# Patient Record
Sex: Female | Born: 1935 | Race: White | Hispanic: No | Marital: Married | Smoking: Never smoker
Health system: Southern US, Community
[De-identification: ages and names within clinical notes are randomized; demographics above are authoritative.]

## PROBLEM LIST (undated history)

## (undated) DIAGNOSIS — K648 Other hemorrhoids: Secondary | ICD-10-CM

## (undated) DIAGNOSIS — K644 Residual hemorrhoidal skin tags: Secondary | ICD-10-CM

## (undated) DIAGNOSIS — E039 Hypothyroidism, unspecified: Secondary | ICD-10-CM

## (undated) DIAGNOSIS — I10 Essential (primary) hypertension: Secondary | ICD-10-CM

## (undated) HISTORY — DX: Hypothyroidism, unspecified: E03.9

## (undated) HISTORY — PX: COLONOSCOPY: SHX174

## (undated) HISTORY — DX: Other hemorrhoids: K64.8

## (undated) HISTORY — DX: Essential (primary) hypertension: I10

## (undated) HISTORY — DX: Residual hemorrhoidal skin tags: K64.4

---

## 1999-09-11 ENCOUNTER — Other Ambulatory Visit: Admission: RE | Admit: 1999-09-11 | Discharge: 1999-09-11 | Payer: Self-pay | Admitting: Internal Medicine

## 2002-10-07 ENCOUNTER — Other Ambulatory Visit: Admission: RE | Admit: 2002-10-07 | Discharge: 2002-10-07 | Payer: Self-pay | Admitting: Internal Medicine

## 2005-09-19 ENCOUNTER — Other Ambulatory Visit: Admission: RE | Admit: 2005-09-19 | Discharge: 2005-09-19 | Payer: Self-pay | Admitting: Internal Medicine

## 2008-10-06 ENCOUNTER — Other Ambulatory Visit: Admission: RE | Admit: 2008-10-06 | Discharge: 2008-10-06 | Payer: Self-pay | Admitting: Internal Medicine

## 2010-11-09 ENCOUNTER — Ambulatory Visit
Admission: RE | Admit: 2010-11-09 | Discharge: 2010-11-09 | Disposition: A | Discharge status: Home / Self Care | Payer: Medicare Other | Source: Ambulatory Visit | Attending: Internal Medicine | Admitting: Internal Medicine

## 2010-11-09 ENCOUNTER — Other Ambulatory Visit: Payer: Self-pay | Admitting: Internal Medicine

## 2010-11-09 DIAGNOSIS — R062 Wheezing: Secondary | ICD-10-CM

## 2010-11-09 DIAGNOSIS — R05 Cough: Secondary | ICD-10-CM

## 2013-09-30 ENCOUNTER — Ambulatory Visit (INDEPENDENT_AMBULATORY_CARE_PROVIDER_SITE_OTHER): Payer: Medicare Other | Admitting: Podiatry

## 2013-09-30 ENCOUNTER — Ambulatory Visit (INDEPENDENT_AMBULATORY_CARE_PROVIDER_SITE_OTHER): Payer: Medicare Other

## 2013-09-30 ENCOUNTER — Encounter: Payer: Self-pay | Admitting: Podiatry

## 2013-09-30 VITALS — BP 147/69 | HR 65 | Resp 16 | Ht 64.0 in | Wt 162.0 lb

## 2013-09-30 DIAGNOSIS — M779 Enthesopathy, unspecified: Secondary | ICD-10-CM

## 2013-09-30 DIAGNOSIS — L600 Ingrowing nail: Secondary | ICD-10-CM

## 2013-09-30 NOTE — Progress Notes (Signed)
   Subjective:    Patient ID: Stacy Glenn, female    DOB: 1933/11/07, 80 y.o.   MRN: [id number]  HPI Comments: i have pain when i walk in both feet. This has been going on for a while. The pain is on the bottom of both feet. Its gotten worse. It hurts to walk bare foot. i have used otc inserts, soak in vinegar and hot water.  Foot Pain      Review of Systems  HENT: Positive for sinus pressure.   Gastrointestinal:       Frequency  Musculoskeletal:       Difficulty walking  All other systems reviewed and are negative.      Objective:   Physical Exam        Assessment & Plan:

## 2013-09-30 NOTE — Progress Notes (Signed)
Subjective:     Patient ID: Stacy Glenn, female   DOB: August 04, 1933, 80 y.o.   MRN: [id number]  Foot Pain   patient presents stating I have a lot of pain on the bottom of both my feet and also I have awful big toenails which are so sore can't wear closed in shoes. Patient states she had removed a number of years ago but not permanently which she should have had done   Review of Systems  All other systems reviewed and are negative.      Objective:   Physical Exam  Nursing note and vitals reviewed. Constitutional: She is oriented to person, place, and time.  Cardiovascular: Intact distal pulses.   Musculoskeletal: Normal range of motion.  Neurological: She is oriented to person, place, and time.  Skin: Skin is warm.   neurovascular status found to be intact with muscle strength adequate and range of motion subtalar and midtarsal joint within normal limits. Patient has quite a bit of discomfort in the lesser metatarsophalangeal joints of both feet with a diminished fat pad and exposure of the bone surfaces noted. Patient is found to have severely thickened damaged nails hallux bilateral left over right    Assessment:     Metatarsalgia with chronic capsulitis and atrophied fat pad bilateral metatarsal bones and severe nail disease with pain hallux both feet    Plan:     Reviewed both conditions and today scanned for custom orthotics to reduce plantar pain. I then went ahead and discussed removal of nails which she wants done and will have done next week and we will get the orthotics as soon as they are returned. Scheduled for AP x2

## 2013-10-07 ENCOUNTER — Ambulatory Visit: Payer: Medicare Other | Admitting: Podiatry

## 2013-12-03 ENCOUNTER — Ambulatory Visit: Payer: Medicare Other

## 2013-12-03 DIAGNOSIS — M779 Enthesopathy, unspecified: Secondary | ICD-10-CM

## 2013-12-03 NOTE — Patient Instructions (Signed)

## 2013-12-03 NOTE — Progress Notes (Signed)
Pt is here to PUO 

## 2015-11-29 ENCOUNTER — Telehealth: Payer: Self-pay | Admitting: Internal Medicine

## 2015-11-29 NOTE — Telephone Encounter (Signed)
I spoke with Dr. Chilton SiGreen.  He is going to call me tomorrow when he gets back to office with his records

## 2015-11-29 NOTE — Telephone Encounter (Signed)
I left a message for Dr. Chilton SiGreen

## 2015-11-30 ENCOUNTER — Encounter: Payer: Self-pay | Admitting: Internal Medicine

## 2015-11-30 NOTE — Telephone Encounter (Signed)
I spoke with Dr. Chilton Si patient did have a colonoscopy with Dr. Leone Payor in 2015.  Dr. Leone Payor also reviewed.  Patient needs an office visit to discuss colonoscopy.   Left message for patient to call back

## 2015-12-05 NOTE — Telephone Encounter (Signed)
Patient is scheduled for january

## 2016-02-13 ENCOUNTER — Ambulatory Visit (INDEPENDENT_AMBULATORY_CARE_PROVIDER_SITE_OTHER): Payer: Medicare Other | Admitting: Internal Medicine

## 2016-02-13 ENCOUNTER — Encounter: Payer: Self-pay | Admitting: Internal Medicine

## 2016-02-13 VITALS — BP 124/66 | HR 80 | Ht 64.0 in | Wt 158.0 lb

## 2016-02-13 DIAGNOSIS — Z1211 Encounter for screening for malignant neoplasm of colon: Secondary | ICD-10-CM | POA: Diagnosis not present

## 2016-02-13 NOTE — Patient Instructions (Signed)
   Call us back with any signs or symptoms that we need to address.     I appreciate the opportunity to care for you. Stan Headarl Gessner, MD, [facility]

## 2016-02-13 NOTE — Progress Notes (Signed)
   Stacie GlazeModena L Renovato 83 y.o. 05-28-1933 [id number]  Assessment & Plan:   Encounter Diagnosis  Name Primary?  . Colon cancer screening Yes   We reviewed things and she has decided not to pursue further colon cancer screening at her age which is reasonable. We did discuss options of hemocclts, Cologuard and colonoscopy. She will see me prn and we can investigate signs and sxs as needed  I appreciate the opportunity to care for this patient. Cc: Dr. Elmore GuiseEd Green  Subjective:   Chief Complaint: colon cancer screening  HPI  83 yo ww - no GI sxs here re: "should I have another colonoscopy?" 2005 no polyps on colonoscopy    Medications, allergies, past medical history, past surgical history, family history and social history are reviewed and updated in the EMR.  Review of Systems Some urinary leakage/incontinence - all others negative  Objective:   Physical Exam BP 124/66   Pulse 80   Ht 5\' 4"  (1.626 m)   Wt 158 lb (71.7 kg)   BMI 27.12 kg/m  NAD Eyes anicteric Appropriate mood/affect Alert and oriented x 3

## 2016-06-24 ENCOUNTER — Other Ambulatory Visit: Payer: Self-pay | Admitting: Internal Medicine

## 2016-06-24 DIAGNOSIS — R310 Gross hematuria: Secondary | ICD-10-CM

## 2016-06-25 ENCOUNTER — Ambulatory Visit
Admission: RE | Admit: 2016-06-25 | Discharge: 2016-06-25 | Disposition: A | Discharge status: Home / Self Care | Payer: Medicare Other | Source: Ambulatory Visit | Attending: Internal Medicine | Admitting: Internal Medicine

## 2016-06-25 DIAGNOSIS — R310 Gross hematuria: Secondary | ICD-10-CM

## 2019-11-10 ENCOUNTER — Encounter: Payer: Self-pay | Admitting: Podiatry

## 2019-11-10 ENCOUNTER — Ambulatory Visit: Payer: Medicare Other | Admitting: Podiatry

## 2019-11-10 ENCOUNTER — Other Ambulatory Visit: Payer: Self-pay

## 2019-11-10 DIAGNOSIS — M79674 Pain in right toe(s): Secondary | ICD-10-CM

## 2019-11-10 DIAGNOSIS — M79675 Pain in left toe(s): Secondary | ICD-10-CM | POA: Diagnosis not present

## 2019-11-10 DIAGNOSIS — B351 Tinea unguium: Secondary | ICD-10-CM

## 2019-11-10 DIAGNOSIS — Q828 Other specified congenital malformations of skin: Secondary | ICD-10-CM | POA: Diagnosis not present

## 2019-11-10 NOTE — Progress Notes (Signed)
This patient presents to the office for evaluation and treatment of her feet.  She says her big toenails have grown thick and painful.  These nails are painful wearing her shoes.  She also has a painful callus under the ball of her right foot.  This has become painful walking and wearing her shoes.  She presents to the office for preventative foot care services.  General Appearance  Alert, conversant and in no acute stress.  Vascular  Dorsalis pedis  are palpable  Bilaterally.  Posterior tibial pulses are weakly palpable.    Capillary return is within normal limits  bilaterally. Temperature is within normal limits  bilaterally.  Neurologic  Senn-Weinstein monofilament wire test within normal limits  bilaterally. Muscle power within normal limits bilaterally.  Nails Thick disfigured discolored nails with subungual debris  from hallux to fifth toes bilaterally. No evidence of bacterial infection or drainage bilaterally.  Orthopedic  No limitations of motion  feet .  No crepitus or effusions noted.  No bony pathology or digital deformities noted.  Plantarflexed second metatarsal head right foot.  Contracted digits  B/L.  Skin  normotropic skin with no porokeratosis noted bilaterally.  No signs of infections or ulcers noted.    Onychomycosis  Porokeratosis  Right forefoot.  Debride nails with nail nipper and dremel tool.  Debride porokeratosis with # 15 blade.  RTC 3 months.  Helane Gunther DPM

## 2020-01-10 ENCOUNTER — Other Ambulatory Visit: Payer: Self-pay | Admitting: Internal Medicine

## 2020-01-10 DIAGNOSIS — Z1231 Encounter for screening mammogram for malignant neoplasm of breast: Secondary | ICD-10-CM

## 2020-01-10 DIAGNOSIS — Z0000 Encounter for general adult medical examination without abnormal findings: Secondary | ICD-10-CM

## 2020-01-28 ENCOUNTER — Other Ambulatory Visit: Payer: Self-pay

## 2020-01-28 ENCOUNTER — Ambulatory Visit (HOSPITAL_BASED_OUTPATIENT_CLINIC_OR_DEPARTMENT_OTHER)
Admission: RE | Admit: 2020-01-28 | Discharge: 2020-01-28 | Disposition: A | Discharge status: Home / Self Care | Payer: Medicare Other | Source: Ambulatory Visit | Attending: Internal Medicine | Admitting: Internal Medicine

## 2020-01-28 ENCOUNTER — Other Ambulatory Visit (HOSPITAL_BASED_OUTPATIENT_CLINIC_OR_DEPARTMENT_OTHER): Payer: Self-pay | Admitting: Internal Medicine

## 2020-01-28 DIAGNOSIS — R410 Disorientation, unspecified: Secondary | ICD-10-CM

## 2020-01-28 DIAGNOSIS — W19XXXD Unspecified fall, subsequent encounter: Secondary | ICD-10-CM

## 2020-01-30 ENCOUNTER — Other Ambulatory Visit: Payer: Self-pay

## 2020-01-30 ENCOUNTER — Inpatient Hospital Stay (HOSPITAL_BASED_OUTPATIENT_CLINIC_OR_DEPARTMENT_OTHER)
Admission: EM | Admit: 2020-01-30 | Discharge: 2020-02-02 | DRG: 690 | Disposition: A | Discharge status: Home Health | Payer: Medicare Other | Attending: Internal Medicine | Admitting: Internal Medicine

## 2020-01-30 ENCOUNTER — Encounter (HOSPITAL_BASED_OUTPATIENT_CLINIC_OR_DEPARTMENT_OTHER): Payer: Self-pay | Admitting: Emergency Medicine

## 2020-01-30 ENCOUNTER — Emergency Department (HOSPITAL_BASED_OUTPATIENT_CLINIC_OR_DEPARTMENT_OTHER): Payer: Medicare Other

## 2020-01-30 DIAGNOSIS — Z79890 Hormone replacement therapy (postmenopausal): Secondary | ICD-10-CM | POA: Diagnosis not present

## 2020-01-30 DIAGNOSIS — N390 Urinary tract infection, site not specified: Secondary | ICD-10-CM | POA: Diagnosis present

## 2020-01-30 DIAGNOSIS — R627 Adult failure to thrive: Secondary | ICD-10-CM | POA: Diagnosis present

## 2020-01-30 DIAGNOSIS — E039 Hypothyroidism, unspecified: Secondary | ICD-10-CM | POA: Diagnosis not present

## 2020-01-30 DIAGNOSIS — Z79899 Other long term (current) drug therapy: Secondary | ICD-10-CM | POA: Diagnosis not present

## 2020-01-30 DIAGNOSIS — I10 Essential (primary) hypertension: Secondary | ICD-10-CM | POA: Diagnosis not present

## 2020-01-30 DIAGNOSIS — B952 Enterococcus as the cause of diseases classified elsewhere: Secondary | ICD-10-CM | POA: Diagnosis not present

## 2020-01-30 DIAGNOSIS — R531 Weakness: Secondary | ICD-10-CM | POA: Diagnosis present

## 2020-01-30 DIAGNOSIS — Z20822 Contact with and (suspected) exposure to covid-19: Secondary | ICD-10-CM | POA: Diagnosis not present

## 2020-01-30 DIAGNOSIS — R509 Fever, unspecified: Secondary | ICD-10-CM

## 2020-01-30 DIAGNOSIS — Z8249 Family history of ischemic heart disease and other diseases of the circulatory system: Secondary | ICD-10-CM | POA: Diagnosis not present

## 2020-01-30 DIAGNOSIS — D649 Anemia, unspecified: Secondary | ICD-10-CM | POA: Diagnosis not present

## 2020-01-30 DIAGNOSIS — N12 Tubulo-interstitial nephritis, not specified as acute or chronic: Principal | ICD-10-CM | POA: Diagnosis present

## 2020-01-30 LAB — CBC WITH DIFFERENTIAL/PLATELET
Abs Immature Granulocytes: 0.02 10*3/uL (ref 0.00–0.07)
Basophils Absolute: 0 10*3/uL (ref 0.0–0.1)
Basophils Relative: 0 %
Eosinophils Absolute: 0 10*3/uL (ref 0.0–0.5)
Eosinophils Relative: 0 %
HCT: 31.8 % — ABNORMAL LOW (ref 36.0–46.0)
Hemoglobin: 10.8 g/dL — ABNORMAL LOW (ref 12.0–15.0)
Immature Granulocytes: 0 %
Lymphocytes Relative: 18 %
Lymphs Abs: 1.4 10*3/uL (ref 0.7–4.0)
MCH: 31 pg (ref 26.0–34.0)
MCHC: 34 g/dL (ref 30.0–36.0)
MCV: 91.4 fL (ref 80.0–100.0)
Monocytes Absolute: 0.4 10*3/uL (ref 0.1–1.0)
Monocytes Relative: 6 %
Neutro Abs: 5.8 10*3/uL (ref 1.7–7.7)
Neutrophils Relative %: 76 %
Platelets: 227 10*3/uL (ref 150–400)
RBC: 3.48 MIL/uL — ABNORMAL LOW (ref 3.87–5.11)
RDW: 13.5 % (ref 11.5–15.5)
WBC: 7.7 10*3/uL (ref 4.0–10.5)
nRBC: 0 % (ref 0.0–0.2)

## 2020-01-30 LAB — URINALYSIS, ROUTINE W REFLEX MICROSCOPIC
Bilirubin Urine: NEGATIVE
Glucose, UA: NEGATIVE mg/dL
Hgb urine dipstick: NEGATIVE
Ketones, ur: 80 mg/dL — AB
Leukocytes,Ua: NEGATIVE
Nitrite: NEGATIVE
Protein, ur: 30 mg/dL — AB
Specific Gravity, Urine: 1.015 (ref 1.005–1.030)
pH: 7 (ref 5.0–8.0)

## 2020-01-30 LAB — URINALYSIS, MICROSCOPIC (REFLEX)

## 2020-01-30 LAB — COMPREHENSIVE METABOLIC PANEL
ALT: 23 U/L (ref 0–44)
AST: 26 U/L (ref 15–41)
Albumin: 3.9 g/dL (ref 3.5–5.0)
Alkaline Phosphatase: 37 U/L — ABNORMAL LOW (ref 38–126)
Anion gap: 11 (ref 5–15)
BUN: 17 mg/dL (ref 8–23)
CO2: 26 mmol/L (ref 22–32)
Calcium: 8.8 mg/dL — ABNORMAL LOW (ref 8.9–10.3)
Chloride: 96 mmol/L — ABNORMAL LOW (ref 98–111)
Creatinine, Ser: 0.97 mg/dL (ref 0.44–1.00)
GFR, Estimated: 58 mL/min — ABNORMAL LOW (ref >60)
Glucose, Bld: 100 mg/dL — ABNORMAL HIGH (ref 70–99)
Potassium: 3.7 mmol/L (ref 3.5–5.1)
Sodium: 133 mmol/L — ABNORMAL LOW (ref 135–145)
Total Bilirubin: 0.7 mg/dL (ref 0.3–1.2)
Total Protein: 6.8 g/dL (ref 6.5–8.1)

## 2020-01-30 LAB — RESP PANEL BY RT-PCR (FLU A&B, COVID) ARPGX2
Influenza A by PCR: NEGATIVE
Influenza B by PCR: NEGATIVE
SARS Coronavirus 2 by RT PCR: NEGATIVE

## 2020-01-30 LAB — AMMONIA: Ammonia: <9 umol/L — ABNORMAL LOW (ref 9–35)

## 2020-01-30 MED ORDER — GADOBUTROL 1 MMOL/ML IV SOLN
7.0000 mL | Freq: Once | INTRAVENOUS | Status: AC | PRN
  Administered 2020-01-30: 7 mL via INTRAVENOUS

## 2020-01-30 MED ORDER — SODIUM CHLORIDE 0.9 % IV SOLN: 1.0000 g | Freq: Once | INTRAVENOUS | Status: DC

## 2020-01-30 MED ORDER — LORAZEPAM 2 MG/ML IJ SOLN
0.5000 mg | Freq: Once | INTRAMUSCULAR | Status: AC
  Administered 2020-01-30: 16:00:00 0.5 mg via INTRAVENOUS
  Filled 2020-01-30: qty 1

## 2020-01-30 MED ORDER — CIPROFLOXACIN IN D5W 400 MG/200ML IV SOLN
400.0000 mg | Freq: Once | INTRAVENOUS | Status: AC
  Administered 2020-01-30: 19:00:00 400 mg via INTRAVENOUS
  Filled 2020-01-30: qty 200

## 2020-01-30 NOTE — ED Provider Notes (Signed)
[facility] EMERGENCY DEPARTMENT Provider Note   CSN: [id number] Arrival date & time: 01/30/20  1225     History Chief Complaint  Patient presents with  . Fever    Stacy Glenn is a 86 y.o. female.  HPI   Patient presents to the ED for evaluation of trouble with fever headache body aches.  Patient has been having issues ongoing for at least the past week.  Patient according to the medical records was first seen for myalgias on December 19.  Patient had laboratory tests urinalysis and covid flu test.  All were negative.  Patient ended up going to another emergency room on the 20th.  There was question of possible bowel obstruction.  Plan was for admission but they did not have any bed availability and so the plan was to have her transferred to [facility].  Has beds were not available patient had improved she ended up getting discharged with plans to go to [facility] emergency room for reevaluation on the 21st.  Patient was seen then.  Patient in getting evaluated and released.  Patient then saw her primary care doctor on Friday.  Etiology of her symptoms still remains unclear.  Patient states she had previously been dying with urinary tract infection had been treated with antibiotics.  Over the weekend she started having increasing trouble with headache as well as pain in her neck and diffuse body aches.  She has felt febrile but has not had a fever in a few days.  Discussed with Dr Thornell Mule.  Pt does have a confirmed enterococcus uti.  Started on cipro.  Would like her to be admitted for uti, failed outpatient treatment.  Past Medical History:  Diagnosis Date  . External hemorrhoids   . Hypertension   . Hypothyroidism   . Internal hemorrhoids     Patient Active Problem List   Diagnosis Date Noted  . Pain due to onychomycosis of toenails of both feet 11/10/2019  . Porokeratosis 11/10/2019    Past Surgical History:  Procedure Laterality Date  . COLONOSCOPY        OB History   No obstetric history on file.     Family History  Problem Relation Age of Onset  . Heart disease Mother   . Heart disease Father   . Heart disease Brother   . Heart disease Brother   . Breast cancer Paternal Aunt   . Colon cancer Neg Hx   . Stomach cancer Neg Hx   . Rectal cancer Neg Hx   . Esophageal cancer Neg Hx   . Liver cancer Neg Hx     Social History   Tobacco Use  . Smoking status: Never Smoker  . Smokeless tobacco: Never Used  Vaping Use  . Vaping Use: Never used  Substance Use Topics  . Alcohol use: No  . Drug use: No    Home Medications Prior to Admission medications   Medication Sig Start Date End Date Taking? Authorizing Provider  halobetasol (ULTRAVATE) 0.05 % cream halobetasol propionate 0.05 % topical cream  APPLY TO AFFECTED AREA AFTER WASHING    [provider]  levothyroxine (SYNTHROID) 88 MCG tablet Take by mouth.    [provider]  losartan-hydrochlorothiazide (HYZAAR) 100-25 MG tablet losartan 100 mg-hydrochlorothiazide 25 mg tablet    [provider]    Allergies    Patient has no known allergies.  Review of Systems   Review of Systems  All other systems reviewed and are negative.  Physical Exam Updated Vital Signs BP 137/63 (BP Location: Right Arm)   Pulse 70   Temp 98.8 F (37.1 C) (Oral)   Resp 16   Ht 1.626 m (5\' 4" )   Wt 70.3 kg   SpO2 99%   BMI 26.61 kg/m   Physical Exam Vitals and nursing note reviewed.  Constitutional:      General: She is not in acute distress.    Appearance: She is well-developed and well-nourished.  HENT:     Head: Normocephalic and atraumatic.     Right Ear: External ear normal.     Left Ear: External ear normal.  Eyes:     General: No scleral icterus.       Right eye: No discharge.        Left eye: No discharge.     Conjunctiva/sclera: Conjunctivae normal.  Neck:     Trachea: No tracheal deviation.  Cardiovascular:     Rate and Rhythm:  Normal rate and regular rhythm.     Pulses: Intact distal pulses.  Pulmonary:     Effort: Pulmonary effort is normal. No respiratory distress.     Breath sounds: Normal breath sounds. No stridor. No wheezing or rales.  Abdominal:     General: Bowel sounds are normal. There is no distension.     Palpations: Abdomen is soft.     Tenderness: There is no abdominal tenderness. There is no guarding or rebound.  Musculoskeletal:        General: No tenderness or edema.     Cervical back: Normal range of motion and neck supple. No rigidity or tenderness.  Skin:    General: Skin is warm and dry.     Findings: No rash.  Neurological:     General: No focal deficit present.     Mental Status: She is alert.     Cranial Nerves: No cranial nerve deficit (no facial droop, extraocular movements intact, no slurred speech).     Sensory: No sensory deficit.     Motor: No abnormal muscle tone or seizure activity.     Coordination: Coordination normal.     Deep Tendon Reflexes: Strength normal.  Psychiatric:        Mood and Affect: Mood and affect normal.     ED Results / Procedures / Treatments   Labs (all labs ordered are listed, but only abnormal results are displayed) Labs Reviewed  CBC WITH DIFFERENTIAL/PLATELET  URINALYSIS, ROUTINE W REFLEX MICROSCOPIC  COMPREHENSIVE METABOLIC PANEL  AMMONIA    EKG None  Radiology No results found.  Procedures Procedures (including critical care time)  Medications Ordered in ED Medications  LORazepam (ATIVAN) injection 0.5 mg (has no administration in time range)    ED Course  I have reviewed the triage vital signs and the nursing notes.  Pertinent labs & imaging results that were available during my care of the patient were reviewed by me and considered in my medical decision making (see chart for details).    MDM Rules/Calculators/A&P                          Patient has had persistent issues with cough, body aches, confusion and fever.   Patient has been seen multiple times for this condition recently.  Patient last saw her primary care doctor on Friday.  Plan is to repeat laboratory tests.  Based on my exam I have low suspicion for meningitis as she has no neck stiffness full  range of motion.  Plan is for MRI, labs.  Care turned over to Dr Adela Lank. Final Clinical Impression(s) / ED Diagnoses pending   Linwood Dibbles, MD 01/30/20 1530

## 2020-01-30 NOTE — ED Notes (Signed)
Pt states rec both Covid Vaccines and rec Booster Vaccine as well at [facility] approx 2 months ago

## 2020-01-30 NOTE — ED Notes (Addendum)
Blood cultures ordered after antibiotics; Spoke to EDP about getting blood cultures after pt has been on antibiotic, EDP said to get them anyway per hospitalist

## 2020-01-30 NOTE — ED Notes (Signed)
As I write this, she is in MRI. 

## 2020-01-30 NOTE — ED Notes (Signed)
Presents today with fever and feeling sore all over, having HA, onset approx 1 month ago. Had some vomiting today. This past Friday went to Primary Care, rec some type of abx injection, and rx for abx as well.

## 2020-01-30 NOTE — ED Triage Notes (Signed)
Pt brought in by family with c/o fever, confusion, balance issues, headache and stiff neck for more than a week. Pt seen by PCP on Friday.

## 2020-01-30 NOTE — ED Notes (Signed)
Pt states she is taking Cipro for a UTI Dx from her primary care MD

## 2020-01-30 NOTE — ED Provider Notes (Signed)
86 yo F with weakness, fevers, headache. Signed out to me by Dr. Lynelle Doctor. Please see his note for full H&P. Briefly seen now 4x in past week.  Sent by PCP for admission.  Plan for labwork, MRI.  Discussion with hospitalist.   Reevaluated the patient patient still feeling very fatigued.  States that this is been an ongoing issue for the past week and she is not been doing very well at home.  At this point I feel the patient likely has pyelonephritis secondary to her Enterococcus urinary tract infection.  I did discuss antibiotic usage for her with the pharmacist, recommended continuing IV ciprofloxacin until better cultures and sensitivities had resulted.  Will discuss with medicine for admission.       Melene Plan, DO 01/30/20 [phone]

## 2020-01-30 NOTE — ED Notes (Signed)
Pt encouraged to void; pt said she will call when she has to go

## 2020-01-31 ENCOUNTER — Observation Stay (HOSPITAL_COMMUNITY): Payer: Medicare Other

## 2020-01-31 DIAGNOSIS — B952 Enterococcus as the cause of diseases classified elsewhere: Secondary | ICD-10-CM

## 2020-01-31 DIAGNOSIS — N390 Urinary tract infection, site not specified: Secondary | ICD-10-CM | POA: Diagnosis not present

## 2020-01-31 LAB — TSH: TSH: 8.828 u[IU]/mL — ABNORMAL HIGH (ref 0.350–4.500)

## 2020-01-31 LAB — COMPREHENSIVE METABOLIC PANEL
ALT: 24 U/L (ref 0–44)
AST: 24 U/L (ref 15–41)
Albumin: 3.7 g/dL (ref 3.5–5.0)
Alkaline Phosphatase: 36 U/L — ABNORMAL LOW (ref 38–126)
Anion gap: 11 (ref 5–15)
BUN: 15 mg/dL (ref 8–23)
CO2: 25 mmol/L (ref 22–32)
Calcium: 8.7 mg/dL — ABNORMAL LOW (ref 8.9–10.3)
Chloride: 99 mmol/L (ref 98–111)
Creatinine, Ser: 0.92 mg/dL (ref 0.44–1.00)
GFR, Estimated: >60 mL/min (ref >60)
Glucose, Bld: 99 mg/dL (ref 70–99)
Potassium: 3.7 mmol/L (ref 3.5–5.1)
Sodium: 135 mmol/L (ref 135–145)
Total Bilirubin: 0.9 mg/dL (ref 0.3–1.2)
Total Protein: 6.7 g/dL (ref 6.5–8.1)

## 2020-01-31 LAB — CBC
HCT: 29.5 % — ABNORMAL LOW (ref 36.0–46.0)
Hemoglobin: 9.9 g/dL — ABNORMAL LOW (ref 12.0–15.0)
MCH: 30.9 pg (ref 26.0–34.0)
MCHC: 33.6 g/dL (ref 30.0–36.0)
MCV: 92.2 fL (ref 80.0–100.0)
Platelets: 215 10*3/uL (ref 150–400)
RBC: 3.2 MIL/uL — ABNORMAL LOW (ref 3.87–5.11)
RDW: 13.4 % (ref 11.5–15.5)
WBC: 7.6 10*3/uL (ref 4.0–10.5)
nRBC: 0 % (ref 0.0–0.2)

## 2020-01-31 LAB — CBC WITH DIFFERENTIAL/PLATELET
Abs Immature Granulocytes: 0.02 10*3/uL (ref 0.00–0.07)
Basophils Absolute: 0 10*3/uL (ref 0.0–0.1)
Basophils Relative: 0 %
Eosinophils Absolute: 0.2 10*3/uL (ref 0.0–0.5)
Eosinophils Relative: 2 %
HCT: 32.5 % — ABNORMAL LOW (ref 36.0–46.0)
Hemoglobin: 10.7 g/dL — ABNORMAL LOW (ref 12.0–15.0)
Immature Granulocytes: 0 %
Lymphocytes Relative: 22 %
Lymphs Abs: 1.8 10*3/uL (ref 0.7–4.0)
MCH: 30.5 pg (ref 26.0–34.0)
MCHC: 32.9 g/dL (ref 30.0–36.0)
MCV: 92.6 fL (ref 80.0–100.0)
Monocytes Absolute: 0.4 10*3/uL (ref 0.1–1.0)
Monocytes Relative: 5 %
Neutro Abs: 5.8 10*3/uL (ref 1.7–7.7)
Neutrophils Relative %: 71 %
Platelets: 257 10*3/uL (ref 150–400)
RBC: 3.51 MIL/uL — ABNORMAL LOW (ref 3.87–5.11)
RDW: 13.6 % (ref 11.5–15.5)
WBC: 8.2 10*3/uL (ref 4.0–10.5)
nRBC: 0 % (ref 0.0–0.2)

## 2020-01-31 LAB — T4, FREE: Free T4: 1.09 ng/dL (ref 0.61–1.12)

## 2020-01-31 LAB — MAGNESIUM: Magnesium: 2.2 mg/dL (ref 1.7–2.4)

## 2020-01-31 LAB — CREATININE, SERUM
Creatinine, Ser: 0.79 mg/dL (ref 0.44–1.00)
GFR, Estimated: >60 mL/min (ref >60)

## 2020-01-31 MED ORDER — ADULT MULTIVITAMIN W/MINERALS CH
1.0000 | ORAL_TABLET | Freq: Every day | ORAL | Status: DC
  Administered 2020-01-31 – 2020-02-02 (×3): 1 via ORAL
  Filled 2020-01-31 (×3): qty 1

## 2020-01-31 MED ORDER — VANCOMYCIN HCL IN DEXTROSE 1-5 GM/200ML-% IV SOLN
1000.0000 mg | INTRAVENOUS | Status: DC
  Administered 2020-02-01: 13:00:00 1000 mg via INTRAVENOUS
  Filled 2020-01-31 (×2): qty 200

## 2020-01-31 MED ORDER — VANCOMYCIN HCL 1250 MG/250ML IV SOLN
1250.0000 mg | Freq: Once | INTRAVENOUS | Status: AC
  Administered 2020-01-31: 12:00:00 1250 mg via INTRAVENOUS
  Filled 2020-01-31: qty 250

## 2020-01-31 MED ORDER — SODIUM CHLORIDE 0.9 % IV SOLN
2.0000 g | INTRAVENOUS | Status: DC
  Administered 2020-02-01 – 2020-02-02 (×2): 2 g via INTRAVENOUS
  Filled 2020-01-31 (×2): qty 2

## 2020-01-31 MED ORDER — ONDANSETRON HCL 4 MG/2ML IJ SOLN: 4.0000 mg | Freq: Four times a day (QID) | INTRAMUSCULAR | Status: DC | PRN

## 2020-01-31 MED ORDER — SODIUM CHLORIDE 0.9 % IV SOLN
1.0000 g | Freq: Once | INTRAVENOUS | Status: AC
  Administered 2020-01-31: 11:00:00 1 g via INTRAVENOUS
  Filled 2020-01-31: qty 10

## 2020-01-31 MED ORDER — SODIUM CHLORIDE 0.9 % IV SOLN
1.0000 g | INTRAVENOUS | Status: DC
  Administered 2020-01-31: 08:00:00 1 g via INTRAVENOUS
  Filled 2020-01-31: qty 10

## 2020-01-31 MED ORDER — LEVOTHYROXINE SODIUM 88 MCG PO TABS
88.0000 ug | ORAL_TABLET | Freq: Every day | ORAL | Status: DC
  Administered 2020-01-31 – 2020-02-01 (×2): 88 ug via ORAL
  Filled 2020-01-31 (×2): qty 1

## 2020-01-31 MED ORDER — POLYVINYL ALCOHOL 1.4 % OP SOLN
1.0000 [drp] | OPHTHALMIC | Status: DC | PRN
  Filled 2020-01-31: qty 15

## 2020-01-31 MED ORDER — ENOXAPARIN SODIUM 40 MG/0.4ML ~~LOC~~ SOLN
40.0000 mg | SUBCUTANEOUS | Status: DC
  Administered 2020-01-31 – 2020-02-02 (×3): 40 mg via SUBCUTANEOUS
  Filled 2020-01-31 (×3): qty 0.4

## 2020-01-31 MED ORDER — ACETAMINOPHEN 325 MG PO TABS
325.0000 mg | ORAL_TABLET | Freq: Four times a day (QID) | ORAL | Status: DC | PRN
  Administered 2020-02-01 – 2020-02-02 (×2): 325 mg via ORAL
  Filled 2020-01-31 (×2): qty 1

## 2020-01-31 MED ORDER — SODIUM CHLORIDE 0.9 % IV SOLN: Freq: Once | INTRAVENOUS | Status: AC

## 2020-01-31 NOTE — ED Notes (Signed)
Spoke to pt son Stacy Glenn per pt request; updated on pt transfer and given pt room information

## 2020-01-31 NOTE — H&P (Signed)
History and Physical    Stacy Glenn VOZ:[id number] DOB: Jun 22, 1933 DOA: 01/30/2020  PCP: Charlane Ferretti, DO  Patient coming from: [facility]  Chief Complaint: generalized weakness and achiness.  HPI: Stacy Glenn is a 86 y.o. female with medical history significant of HTN, hypothyroidism. Presenting with generalized weakness and achiness. She reports her symptoms began several weeks ago. She was seen by a urologist at the time and was diagnosed with an enterococcus UTI. She was placed on abx, but she didn't seem to improve. She made multiple visits to multiple EDs over the next couple of weeks, but did not seem to have an improvement in her weakness and achiness. She saw her PCP who changed up her antibiotic regimen. I spoke with her PCP, Dr. Thornell Mule. He notes that she seemed a little more confused and weak. He gave her rocephin and cipro; but she apparently did not improve over the next couple of days. She decided that she needed to come back to the ED. She denies any other aggravating or alleviating factors. She denies any other treatments.     ED Course: At [facility], MRI brain did not show any acute issue. Lab work was ok. UCx was collected and she was started on IV cipro with the presumption that she had a UTI worsening to pyelonephritis. [facility] was called for admission.   Review of Systems:  Review of systems is otherwise negative for all not mentioned in HPI.   PMHx Past Medical History:  Diagnosis Date  . External hemorrhoids   . Hypertension   . Hypothyroidism   . Internal hemorrhoids     PSHx Past Surgical History:  Procedure Laterality Date  . COLONOSCOPY      SocHx  reports that she has never smoked. She has never used smokeless tobacco. She reports that she does not drink alcohol and does not use drugs.  No Known Allergies  FamHx Family History  Problem Relation Age of Onset  . Heart disease Mother   . Heart disease Father   . Heart disease Brother   . Heart disease  Brother   . Breast cancer Paternal Aunt   . Colon cancer Neg Hx   . Stomach cancer Neg Hx   . Rectal cancer Neg Hx   . Esophageal cancer Neg Hx   . Liver cancer Neg Hx     Prior to Admission medications   Medication Sig Start Date End Date Taking? Authorizing Provider  ciprofloxacin (CIPRO) 500 MG tablet Take 1 tablet by mouth in the morning and at bedtime. 10 day supply 01/28/20  Yes [provider]  GAVILAX 17 GM/SCOOP powder Take 17 g by mouth daily as needed for constipation. 01/26/20  Yes [provider]  levothyroxine (SYNTHROID) 88 MCG tablet Take 88 mcg by mouth daily before breakfast.   Yes [provider]  losartan-hydrochlorothiazide (HYZAAR) 100-25 MG tablet Take 1 tablet by mouth daily.   Yes [provider]  Multiple Vitamin (MULTIVITAMIN WITH MINERALS) TABS tablet Take 1 tablet by mouth daily.   Yes [provider]  polyvinyl alcohol (LIQUIFILM TEARS) 1.4 % ophthalmic solution Place 1 drop into both eyes as needed for dry eyes.   Yes [provider]  amoxicillin (AMOXIL) 500 MG tablet Take 500 mg by mouth 3 (three) times daily. 01/20/20   [provider]    Physical Exam: Vitals:   01/30/20 2100 01/31/20 0030 01/31/20 0300 01/31/20 0445  BP: 99/66 (!) 126/51 (!) 144/64 (!) 127/53  Pulse: Marland Kitchen)  58 63 70 68  Resp: 14 14 14 16   Temp:   98.5 F (36.9 C) 98.2 F (36.8 C)  TempSrc:   Oral Oral  SpO2: 95% 96% 99% 99%  Weight:      Height:        General: 86 y.o. female resting in bed in NAD Eyes: PERRL, normal sclera ENMT: Nares patent w/o discharge, orophaynx clear, dentition normal, ears w/o discharge/lesions/ulcers Neck: Supple, trachea midline Cardiovascular: RRR, +S1, S2, no m/g/r, equal pulses throughout Respiratory: CTABL, no w/r/r, normal WOB GI: BS+, NDNT, no masses noted, no organomegaly noted MSK: No e/c/c Skin: No rashes, bruises, ulcerations noted Neuro: A&O x 3, no focal deficits, no nuchal  rigidity, no photosensitivity Psyc: Appropriate interaction and affect, calm/cooperative  Labs on Admission: I have personally reviewed following labs and imaging studies  CBC: Recent Labs  Lab 01/30/20 1606  WBC 7.7  NEUTROABS 5.8  HGB 10.8*  HCT 31.8*  MCV 91.4  PLT 227   Basic Metabolic Panel: Recent Labs  Lab 01/30/20 1606  NA 133*  K 3.7  CL 96*  CO2 26  GLUCOSE 100*  BUN 17  CREATININE 0.97  CALCIUM 8.8*   GFR: Estimated Creatinine Clearance: 41.5 mL/min (by C-G formula based on SCr of 0.97 mg/dL). Liver Function Tests: Recent Labs  Lab 01/30/20 1606  AST 26  ALT 23  ALKPHOS 37*  BILITOT 0.7  PROT 6.8  ALBUMIN 3.9   No results for input(s): LIPASE, AMYLASE in the last 168 hours. Recent Labs  Lab 01/30/20 1606  AMMONIA <9*   Coagulation Profile: No results for input(s): INR, PROTIME in the last 168 hours. Cardiac Enzymes: No results for input(s): CKTOTAL, CKMB, CKMBINDEX, TROPONINI in the last 168 hours. BNP (last 3 results) No results for input(s): PROBNP in the last 8760 hours. HbA1C: No results for input(s): HGBA1C in the last 72 hours. CBG: No results for input(s): GLUCAP in the last 168 hours. Lipid Profile: No results for input(s): CHOL, HDL, LDLCALC, TRIG, CHOLHDL, LDLDIRECT in the last 72 hours. Thyroid Function Tests: No results for input(s): TSH, T4TOTAL, FREET4, T3FREE, THYROIDAB in the last 72 hours. Anemia Panel: No results for input(s): VITAMINB12, FOLATE, FERRITIN, TIBC, IRON, RETICCTPCT in the last 72 hours. Urine analysis:    Component Value Date/Time   COLORURINE YELLOW 01/30/2020 2208   APPEARANCEUR CLEAR 01/30/2020 2208   LABSPEC 1.015 01/30/2020 2208   PHURINE 7.0 01/30/2020 2208   GLUCOSEU NEGATIVE 01/30/2020 2208   HGBUR NEGATIVE 01/30/2020 2208   BILIRUBINUR NEGATIVE 01/30/2020 2208   KETONESUR 80 (A) 01/30/2020 2208   PROTEINUR 30 (A) 01/30/2020 2208   NITRITE NEGATIVE 01/30/2020 2208   LEUKOCYTESUR NEGATIVE  01/30/2020 2208    Radiological Exams on Admission: MR Brain W and Wo Contrast  Result Date: 01/30/2020 CLINICAL DATA:  Altered mental status and headaches. EXAM: MRI HEAD WITHOUT AND WITH CONTRAST TECHNIQUE: Multiplanar, multiecho pulse sequences of the brain and surrounding structures were obtained without and with intravenous contrast. CONTRAST:  43mL GADAVIST GADOBUTROL 1 MMOL/ML IV SOLN COMPARISON:  Head CT 01/28/2020 FINDINGS: Multiple sequences are mildly to moderately motion degraded. Brain: There is no evidence of an acute infarct, intracranial hemorrhage, mass, midline shift, or extra-axial fluid collection. T2 hyperintensities in the cerebral white matter bilaterally are nonspecific but compatible with mild chronic small vessel ischemic disease. There is mild to moderate generalized cerebral atrophy. No abnormal enhancement is identified. Vascular: Major intracranial vascular flow voids are preserved. Skull and upper cervical spine:  Unremarkable bone marrow signal. Sinuses/Orbits: Bilateral cataract extraction. Paranasal sinuses and mastoid air cells are clear. Other: None. IMPRESSION: 1. No acute intracranial abnormality. 2. Mild chronic small vessel ischemic disease. Electronically Signed   By: Sebastian Ache M.D.   On: 01/30/2020 17:24   DG CHEST PORT 1 VIEW  Result Date: 01/31/2020 CLINICAL DATA:  86 year old female with weakness and fever. Recently diagnosed with UTI. EXAM: PORTABLE CHEST 1 VIEW COMPARISON:  Chest radiographs 11/09/2010. FINDINGS: Portable AP semi upright view at 0557 hours. Normal lung volumes and mediastinal contours. Visualized tracheal air column is within normal limits. Allowing for portable technique the lungs are clear. Negative visible bowel gas pattern. No acute osseous abnormality identified. IMPRESSION: Negative portable chest. Electronically Signed   By: Odessa Fleming M.D.   On: 01/31/2020 06:26    EKG: Independently reviewed. NSR, no st  changes  Assessment/Plan UTI     - place in obs, med-surg     - started on rocephin and cipro IV; stop cipro, increase rocephin to 2 g and add vanc; if enterococcus, vanc should cover, if it is another entity, rocephin is generally better until we can narrow it      - follow UCx      - recent CT ab/pelvis (12/21 in Care Everywhere) shows cystitis but not pyelonephritis. She is not complaining about ab pain; she has no fever or white count; let's hold on further imaging for now  Generalized weakness     - Secondary to infection? Secondary to hypothyroidism?     - check TSH/FT4/FT3     - PT eval  Hypothyroidism     - checking TSH/FT4/FT3     - continue levothyroxine  HTN     - resume home meds  Normocytic anemia     - no evidence of bleed, follow  DVT prophylaxis: lovenox  Code Status: FULL  Family Communication: None at bedside.  Consults called: None.   Status is: Observation  The patient remains OBS appropriate and will d/c before 2 midnights.  Dispo: The patient is from: Home              Anticipated d/c is to: Home              Anticipated d/c date is: 1 day              Patient currently is not medically stable to d/c.  Teddy Spike DO [facility]  If 7PM-7AM, please contact night-coverage [email or web address]  01/31/2020, 8:32 AM

## 2020-01-31 NOTE — Evaluation (Signed)
Physical Therapy Evaluation Patient Details Name: Stacy Glenn MRN: [id number] DOB: 25-Sep-1933 Today's Date: 01/31/2020   History of Present Illness  86 y.o. female with medical history significant of HTN, hypothyroidism. Presenting with generalized weakness and achiness. admitted with UTI worsening to pyelonephritis  Clinical Impression  Pt admitted with above diagnosis.  Pt amb hallway distance with mild gait instability, attempting to "furniture walk" or hold rail throughout distance. May need HHPT at d/c depending on progress (has second level bedroom). Continue PT in acute setting  Pt currently with functional limitations due to the deficits listed below (see PT Problem List). Pt will benefit from skilled PT to increase their independence and safety with mobility to allow discharge to the venue listed below.       Follow Up Recommendations Home health PT    Equipment Recommendations  None recommended by PT (pt reports she has cane and RW -?)    Recommendations for Other Services       Precautions / Restrictions Precautions Precautions: Fall Restrictions Weight Bearing Restrictions: No      Mobility  Bed Mobility Overal bed mobility: Modified Independent                  Transfers Overall transfer level: Needs assistance Equipment used: None Transfers: Sit to/from Stand Sit to Stand: Min guard         General transfer comment: for safety and line management  Ambulation/Gait Ambulation/Gait assistance: Min guard;Min assist Gait Distance (Feet): 350 Feet Assistive device: IV Pole;None Gait Pattern/deviations: Step-through pattern;Decreased stride length;Drifts right/left     General Gait Details: overall unsteady gait, attempts to furniture walk or use rail in hallway. stability and stride length improve with distance althouth guarded/mild instability gait persists without UE support  Stairs            Wheelchair Mobility    Modified Rankin  (Stroke Patients Only)       Balance Overall balance assessment: Needs assistance (one fall in the last "few mos")   Sitting balance-Leahy Scale: Good       Standing balance-Leahy Scale: Fair               High level balance activites: Direction changes;Turns High Level Balance Comments: min/guard for safety             Pertinent Vitals/Pain Pain Assessment: No/denies pain    Home Living Family/patient expects to be discharged to:: Private residence Living Arrangements: Spouse/significant other Available Help at Discharge: Family Type of Home: House       Home Layout: Two level;Bed/bath upstairs Home Equipment: Environmental consultant - 2 wheels      Prior Function Level of Independence: Independent               Hand Dominance        Extremity/Trunk Assessment   Upper Extremity Assessment Upper Extremity Assessment: Generalized weakness    Lower Extremity Assessment Lower Extremity Assessment: Generalized weakness       Communication   Communication: No difficulties  Cognition Arousal/Alertness: Awake/alert Behavior During Therapy: WFL for tasks assessed/performed Overall Cognitive Status: Within Functional Limits for tasks assessed                                        General Comments      Exercises     Assessment/Plan    PT Assessment Patient needs continued PT  services  PT Problem List Decreased strength;Decreased mobility;Decreased activity tolerance;Decreased balance       PT Treatment Interventions DME instruction;Therapeutic activities;Gait training;Functional mobility training;Therapeutic exercise;Patient/family education    PT Goals (Current goals can be found in the Care Plan section)  Acute Rehab PT Goals Patient Stated Goal: home soon PT Goal Formulation: With patient Time For Goal Achievement: 02/14/20 Potential to Achieve Goals: Good    Frequency Min 3X/week   Barriers to discharge         Co-evaluation               AM-PAC PT "6 Clicks" Mobility  Outcome Measure Help needed turning from your back to your side while in a flat bed without using bedrails?: A Little Help needed moving from lying on your back to sitting on the side of a flat bed without using bedrails?: A Little Help needed moving to and from a bed to a chair (including a wheelchair)?: A Little Help needed standing up from a chair using your arms (e.g., wheelchair or bedside chair)?: A Little Help needed to walk in hospital room?: A Little Help needed climbing 3-5 steps with a railing? : A Little 6 Click Score: 18    End of Session Equipment Utilized During Treatment: Gait belt Activity Tolerance: Patient tolerated treatment well Patient left: with call bell/phone within reach;in bed (EOB, alarm not set on PT arrival)   PT Visit Diagnosis: Unsteadiness on feet (R26.81)    Time: 5638-9373 PT Time Calculation (min) (ACUTE ONLY): 18 min   Charges:   PT Evaluation $PT Eval Low Complexity: 1 Low          Harue Pribble, PT  Acute Rehab Dept ([facility]) [phone] Pager [phone]  01/31/2020   [facility] 01/31/2020, 3:20 PM

## 2020-01-31 NOTE — Progress Notes (Signed)
Pharmacy Antibiotic Note  Stacy Glenn is a 86 y.o. female admitted on 01/30/2020.  Pharmacy has been consulted for Vancomycin dosing for Enterococcus UTI (old culture from outside facility); new culture collected and pending.  Plan: Vancomycin 1250 mg IV x1 then 1000 mg IV q24h. Measure Vanc levels as needed Follow up renal function, culture results, and clinical course.   Height: 5\' 4"  (162.6 cm) Weight: 70.3 kg (155 lb) IBW/kg (Calculated) : 54.7  Temp (24hrs), Avg:98.7 F (37.1 C), Min:98.2 F (36.8 C), Max:99.5 F (37.5 C)  Recent Labs  Lab 01/30/20 1606 01/31/20 0903  WBC 7.7 8.2  CREATININE 0.97 0.92    Estimated Creatinine Clearance: 43.8 mL/min (by C-G formula based on SCr of 0.92 mg/dL).    No Known Allergies  Antimicrobials this admission: 12/26 Cipro x1 12/27 Ceftriaxone >>  12/27 Vancomycin >>   Dose adjustments this admission:   Microbiology results: 12/26 UCx:  12/26 BCx:   Thank you for allowing pharmacy to be a part of this patient's care.  1/27 PharmD, BCPS Clinical Pharmacist [facility] main pharmacy [phone] 01/31/2020 9:38 AM

## 2020-02-01 DIAGNOSIS — E039 Hypothyroidism, unspecified: Secondary | ICD-10-CM | POA: Diagnosis present

## 2020-02-01 DIAGNOSIS — N390 Urinary tract infection, site not specified: Secondary | ICD-10-CM | POA: Diagnosis not present

## 2020-02-01 DIAGNOSIS — D649 Anemia, unspecified: Secondary | ICD-10-CM | POA: Diagnosis present

## 2020-02-01 DIAGNOSIS — R5381 Other malaise: Secondary | ICD-10-CM

## 2020-02-01 DIAGNOSIS — R627 Adult failure to thrive: Secondary | ICD-10-CM | POA: Diagnosis present

## 2020-02-01 DIAGNOSIS — N12 Tubulo-interstitial nephritis, not specified as acute or chronic: Secondary | ICD-10-CM | POA: Diagnosis present

## 2020-02-01 DIAGNOSIS — Z8249 Family history of ischemic heart disease and other diseases of the circulatory system: Secondary | ICD-10-CM | POA: Diagnosis not present

## 2020-02-01 DIAGNOSIS — B952 Enterococcus as the cause of diseases classified elsewhere: Secondary | ICD-10-CM | POA: Diagnosis present

## 2020-02-01 DIAGNOSIS — Z79899 Other long term (current) drug therapy: Secondary | ICD-10-CM | POA: Diagnosis not present

## 2020-02-01 DIAGNOSIS — Z20822 Contact with and (suspected) exposure to covid-19: Secondary | ICD-10-CM | POA: Diagnosis present

## 2020-02-01 DIAGNOSIS — R531 Weakness: Secondary | ICD-10-CM | POA: Diagnosis present

## 2020-02-01 DIAGNOSIS — I10 Essential (primary) hypertension: Secondary | ICD-10-CM | POA: Diagnosis present

## 2020-02-01 DIAGNOSIS — Z79890 Hormone replacement therapy (postmenopausal): Secondary | ICD-10-CM | POA: Diagnosis not present

## 2020-02-01 LAB — COMPREHENSIVE METABOLIC PANEL
ALT: 23 U/L (ref 0–44)
AST: 22 U/L (ref 15–41)
Albumin: 3.3 g/dL — ABNORMAL LOW (ref 3.5–5.0)
Alkaline Phosphatase: 32 U/L — ABNORMAL LOW (ref 38–126)
Anion gap: 8 (ref 5–15)
BUN: 15 mg/dL (ref 8–23)
CO2: 25 mmol/L (ref 22–32)
Calcium: 8.3 mg/dL — ABNORMAL LOW (ref 8.9–10.3)
Chloride: 106 mmol/L (ref 98–111)
Creatinine, Ser: 0.95 mg/dL (ref 0.44–1.00)
GFR, Estimated: 59 mL/min — ABNORMAL LOW (ref >60)
Glucose, Bld: 95 mg/dL (ref 70–99)
Potassium: 3.5 mmol/L (ref 3.5–5.1)
Sodium: 139 mmol/L (ref 135–145)
Total Bilirubin: 0.8 mg/dL (ref 0.3–1.2)
Total Protein: 5.7 g/dL — ABNORMAL LOW (ref 6.5–8.1)

## 2020-02-01 LAB — CBC
HCT: 29.7 % — ABNORMAL LOW (ref 36.0–46.0)
Hemoglobin: 9.8 g/dL — ABNORMAL LOW (ref 12.0–15.0)
MCH: 31 pg (ref 26.0–34.0)
MCHC: 33 g/dL (ref 30.0–36.0)
MCV: 94 fL (ref 80.0–100.0)
Platelets: 234 10*3/uL (ref 150–400)
RBC: 3.16 MIL/uL — ABNORMAL LOW (ref 3.87–5.11)
RDW: 13.5 % (ref 11.5–15.5)
WBC: 7.8 10*3/uL (ref 4.0–10.5)
nRBC: 0 % (ref 0.0–0.2)

## 2020-02-01 LAB — URINE CULTURE: Culture: NO GROWTH

## 2020-02-01 LAB — T3, FREE: T3, Free: 1.5 pg/mL — ABNORMAL LOW (ref 2.0–4.4)

## 2020-02-01 MED ORDER — POTASSIUM CHLORIDE CRYS ER 20 MEQ PO TBCR
40.0000 meq | EXTENDED_RELEASE_TABLET | Freq: Once | ORAL | Status: AC
  Administered 2020-02-01: 08:00:00 40 meq via ORAL
  Filled 2020-02-01: qty 2

## 2020-02-01 MED ORDER — LEVOTHYROXINE SODIUM 100 MCG PO TABS
100.0000 ug | ORAL_TABLET | Freq: Every day | ORAL | Status: DC
  Administered 2020-02-02: 06:00:00 100 ug via ORAL
  Filled 2020-02-01: qty 1

## 2020-02-01 MED ORDER — POLYETHYLENE GLYCOL 3350 17 G PO PACK
17.0000 g | PACK | Freq: Every day | ORAL | Status: DC
  Administered 2020-02-01: 13:00:00 17 g via ORAL
  Filled 2020-02-01 (×2): qty 1

## 2020-02-01 NOTE — TOC Progression Note (Signed)
Transition of Care [facility]) - Progression Note    Patient Details  Name: Stacy Glenn MRN: [id number] Date of Birth: July 18, 1933  [facility] CM/SW Contact  Armanda Heritage, RN Phone Number: 02/01/2020, 12:29 PM  Clinical Narrative:    CM spoke with patient re [facility] PT recommendations.  Patient set up for HHPT with [facility].     Expected Discharge Plan: Home w Home Health Services Barriers to Discharge: Continued Medical Work up  Expected Discharge Plan and Services Expected Discharge Plan: Home w Home Health Services   Discharge Planning Services: CM Consult Post Acute Care Choice: Home Health Living arrangements for the past 2 months: Single Family Home                           HH Arranged: PT HH Agency: [facility] Date [facility] Agency Contacted: 02/01/20 Time HH Agency Contacted: 1229 Representative spoke with at [facility] Agency: Becky Sax   Social Determinants of Health (SDOH) Interventions    Readmission Risk Interventions No flowsheet data found.

## 2020-02-01 NOTE — Progress Notes (Signed)
[facility] PROGRESS NOTE   Stacie GlazeModena L Spainhower UEA:[id number] DOB: [birth year] DOA: 01/30/2020  PCP: Charlane FerrettiSkakle, [place], DO  Brief History/Interval Summary: 86 y.o. female with medical history significant of HTN, hypothyroidism. Presenting with generalized weakness and achiness. She reports her symptoms began several weeks ago. She was seen by a urologist at the time and was diagnosed with an enterococcus UTI. She was placed on abx, but she didn't seem to improve. She made multiple visits to multiple EDs over the next couple of weeks, but did not seem to have an improvement in her weakness and achiness. She saw her PCP who changed up her antibiotic regimen.  The admitting doctor spoke with her PCP, Dr. Thornell MuleSkakle. He noted that she seemed a little more confused and weak. He gave her rocephin and cipro; but she apparently did not improve over the next couple of days. She decided that she needed to come back to the ED. [facility], MRI brain did not show any acute issue. Lab work was ok. UCx was collected and she was started on IV cipro with the presumption that she had a UTI worsening to pyelonephritis. [facility] was called for admission.   Reason for Visit: Urinary tract infection with physical deconditioning  Consultants: None  Procedures: None  Antibiotics: Anti-infectives (From admission, onward)   Start     Dose/Rate Route Frequency Ordered Stop   02/01/20 1200  vancomycin (VANCOCIN) IVPB 1000 mg/200 mL premix        1,000 mg 200 mL/hr over 60 Minutes Intravenous Every 24 hours 01/31/20 1101     02/01/20 0800  cefTRIAXone (ROCEPHIN) 2 g in sodium chloride 0.9 % 100 mL IVPB        2 g 200 mL/hr over 30 Minutes Intravenous Every 24 hours 01/31/20 0917     01/31/20 1030  vancomycin (VANCOREADY) IVPB 1250 mg/250 mL        1,250 mg 166.7 mL/hr over 90 Minutes Intravenous  Once 01/31/20 0939 01/31/20 1341   01/31/20 1030  cefTRIAXone (ROCEPHIN) 1 g in sodium chloride 0.9 % 100 mL IVPB        1 g 200  mL/hr over 30 Minutes Intravenous  Once 01/31/20 0941 01/31/20 1117   01/31/20 0800  cefTRIAXone (ROCEPHIN) 1 g in sodium chloride 0.9 % 100 mL IVPB  Status:  Discontinued        1 g 200 mL/hr over 30 Minutes Intravenous Every 24 hours 01/31/20 0503 01/31/20 0917   01/30/20 1830  cefTRIAXone (ROCEPHIN) 1 g in sodium chloride 0.9 % 100 mL IVPB  Status:  Discontinued        1 g 200 mL/hr over 30 Minutes Intravenous  Once 01/30/20 1815 01/30/20 1823   01/30/20 1830  ciprofloxacin (CIPRO) IVPB 400 mg        400 mg 200 mL/hr over 60 Minutes Intravenous  Once 01/30/20 1823 01/30/20 2112      Subjective/Interval History: Patient mentions that she is feeling slightly better today compared to yesterday feels stronger.  Denies any abdominal pain nausea or vomiting.  No chest pain or shortness of breath.    Assessment/Plan:  Urinary tract infection secondary to Enterococcus Patient with recent Enterococcus UTI which apparently has not improved despite outpatient management.  UA done here in the hospital does not suggest infection.  However outside cultures have been positive for Enterococcus.  Patient currently on ceftriaxone and vancomycin.   Looks like previously she has been on amoxicillin.  Subsequently given ciprofloxacin and a  dose of ceftriaxone at her PCPs office.  We will get a copy of her culture report from PCP office.  Physical deconditioning/failure to thrive This is all thought to be a result of her UTI.  However her TSH was noted to be elevated with low free T3.  See below.  PT and OT evaluation.  Looks like she is getting need home health.  MRI brain was done due to her weakness and does not show any acute finding.  Chest x-ray was also unremarkable.  Hypothyroidism Patient noted to be on 88 mcg of levothyroxine.  TSH noted to be elevated with low free T3.  We will increase the dose of levothyroxine for now.  She will need to have her thyroid function tests rechecked in the  outpatient setting.  Essential hypertension Noted to be on losartan HCTZ at home which is currently on hold.  Blood pressure is reasonably well-controlled though occasional low readings noted.  Normocytic anemia Hemoglobin stable.  No evidence of overt bleeding.  Continue to monitor.  Outpatient evaluation.  DVT Prophylaxis:  Lovenox Code Status: Full code Family Communication: Discussed with the patient.  No family at bedside Disposition Plan: Hopefully return home when improved  Status is: Observation  The patient will require care spanning > 2 midnights and should be moved to inpatient because: IV treatments appropriate due to intensity of illness or inability to take PO  Dispo: The patient is from: Home              Anticipated d/c is to: Home              Anticipated d/c date is: 1 day              Patient currently is not medically stable to d/c.       Medications:  Scheduled: . enoxaparin (LOVENOX) injection  40 mg Subcutaneous Q24H  . [START ON 02/02/2020] levothyroxine  100 mcg Oral Q0600  . multivitamin with minerals  1 tablet Oral Daily  . polyethylene glycol  17 g Oral Daily   Continuous: . cefTRIAXone (ROCEPHIN)  IV 2 g (02/01/20 0827)  . vancomycin     ZHG:[id number], ondansetron (ZOFRAN) IV, polyvinyl alcohol   Objective:  Vital Signs  Vitals:   01/31/20 1328 01/31/20 2059 02/01/20 0134 02/01/20 0615  BP: (!) 105/41 136/60 (!) 116/50 117/62  Pulse: 75 68 65 80  Resp: 17 18 16 18   Temp: 98.5 F (36.9 C) 98.2 F (36.8 C) 98 F (36.7 C) 98.9 F (37.2 C)  TempSrc:  Oral Oral Oral  SpO2: 98% 98% 95% 98%  Weight:      Height:        Intake/Output Summary (Last 24 hours) at 02/01/2020 1002 Last data filed at 02/01/2020 0942 Gross per 24 hour  Intake 1200 ml  Output 400 ml  Net 800 ml   Filed Weights   01/30/20 1251  Weight: 70.3 kg    General appearance: Awake alert.  In no distress Resp: Clear to auscultation bilaterally.  Normal  effort Cardio: S1-S2 is normal regular.  No S3-S4.  No rubs murmurs or bruit GI: Abdomen is soft.  Nontender nondistended.  Bowel sounds are present normal.  No masses organomegaly Extremities: No edema.  Full range of motion of lower extremities. Neurologic: Alert and oriented x3.  No focal neurological deficits.    Lab Results:  Data Reviewed: I have personally reviewed following labs and imaging studies  CBC: Recent Labs  Lab 01/30/20  1606 01/31/20 0903 01/31/20 0948 02/01/20 0543  WBC 7.7 8.2 7.6 7.8  NEUTROABS 5.8 5.8  --   --   HGB 10.8* 10.7* 9.9* 9.8*  HCT 31.8* 32.5* 29.5* 29.7*  MCV 91.4 92.6 92.2 94.0  PLT 227 257 215 234    Basic Metabolic Panel: Recent Labs  Lab 01/30/20 1606 01/31/20 0903 01/31/20 0948 02/01/20 0543  NA 133* 135  --  139  K 3.7 3.7  --  3.5  CL 96* 99  --  106  CO2 26 25  --  25  GLUCOSE 100* 99  --  95  BUN 17 15  --  15  CREATININE 0.97 0.92 0.79 0.95  CALCIUM 8.8* 8.7*  --  8.3*  MG  --  2.2  --   --     GFR: Estimated Creatinine Clearance: 42.4 mL/min (by C-G formula based on SCr of 0.95 mg/dL).  Liver Function Tests: Recent Labs  Lab 01/30/20 1606 01/31/20 0903 02/01/20 0543  AST 26 24 22   ALT 23 24 23   ALKPHOS 37* 36* 32*  BILITOT 0.7 0.9 0.8  PROT 6.8 6.7 5.7*  ALBUMIN 3.9 3.7 3.3*    Recent Labs  Lab 01/30/20 1606  AMMONIA <9*     Thyroid Function Tests: Recent Labs    01/31/20 0948  TSH 8.828*  FREET4 1.09  T3FREE 1.5*      Recent Results (from the past 240 hour(s))  Resp Panel by RT-PCR (Flu A&B, Covid) Nasopharyngeal Swab     Status: None   Collection Time: 01/30/20  4:07 PM   Specimen: Nasopharyngeal Swab; Nasopharyngeal(NP) swabs in vial transport medium  Result Value Ref Range Status   SARS Coronavirus 2 by RT PCR NEGATIVE NEGATIVE Final    Comment: (NOTE) SARS-CoV-2 target nucleic acids are NOT DETECTED.  The SARS-CoV-2 RNA is generally detectable in upper respiratory specimens  during the acute phase of infection. The lowest concentration of SARS-CoV-2 viral copies this assay can detect is 138 copies/mL. A negative result does not preclude SARS-Cov-2 infection and should not be used as the sole basis for treatment or other patient management decisions. A negative result may occur with  improper specimen collection/handling, submission of specimen other than nasopharyngeal swab, presence of viral mutation(s) within the areas targeted by this assay, and inadequate number of viral copies(<138 copies/mL). A negative result must be combined with clinical observations, patient history, and epidemiological information. The expected result is Negative.  Fact Sheet for Patients:  02/02/20  Fact Sheet for Healthcare Providers:  02/01/20  This test is no t yet approved or cleared by the [employer] FDA and  has been authorized for detection and/or diagnosis of SARS-CoV-2 by FDA under an Emergency Use Authorization (EUA). This EUA will remain  in effect (meaning this test can be used) for the duration of the COVID-19 declaration under Section 564(b)(1) of the Act, 21 U.S.C.section 360bbb-3(b)(1), unless the authorization is terminated  or revoked sooner.       Influenza A by PCR NEGATIVE NEGATIVE Final   Influenza B by PCR NEGATIVE NEGATIVE Final    Comment: (NOTE) The Xpert Xpress SARS-CoV-2/FLU/RSV plus assay is intended as an aid in the diagnosis of influenza from Nasopharyngeal swab specimens and should not be used as a sole basis for treatment. Nasal washings and aspirates are unacceptable for Xpert Xpress SARS-CoV-2/FLU/RSV testing.  Fact Sheet for Patients: Serious[employer]ker[employer]it  Fact Sheet for Healthcare Providers: [place]  This test is not yet approved or cleared  by the [place] and has been authorized for detection  and/or diagnosis of SARS-CoV-2 by FDA under an Emergency Use Authorization (EUA). This EUA will remain in effect (meaning this test can be used) for the duration of the COVID-19 declaration under Section 564(b)(1) of the Act, 21 U.S.C. section 360bbb-3(b)(1), unless the authorization is terminated or revoked.  Performed at [facility], [address] [place], [place] [address]   Blood culture (routine x 2)     Status: None (Preliminary result)   Collection Time: 01/30/20  7:43 PM   Specimen: BLOOD LEFT ARM  Result Value Ref Range Status   Specimen Description   Final    BLOOD LEFT ARM Performed at [facility], [address] [place], [place] [address]    Special Requests   Final    BOTTLES DRAWN AEROBIC ONLY Blood Culture results may not be optimal due to an inadequate volume of blood received in culture bottles Performed at [facility], [address] [place], [place] [address]    Culture   Final    NO GROWTH < 12 HOURS Performed at [facility], [address], [place], [place] [address]    Report Status PENDING  Incomplete  Blood culture (routine x 2)     Status: None (Preliminary result)   Collection Time: 01/30/20  7:44 PM   Specimen: BLOOD LEFT ARM  Result Value Ref Range Status   Specimen Description   Final    BLOOD LEFT ARM Performed at [facility], [address] [place], [place] [address]    Special Requests   Final    BOTTLES DRAWN AEROBIC AND ANAEROBIC Blood Culture results may not be optimal due to an inadequate volume of blood received in culture bottles Performed at [facility], [address] [place], [place] [address]    Culture   Final    NO GROWTH < 12 HOURS Performed at [facility], [address], [place], [place] [address]    Report Status PENDING  Incomplete  Urine culture     Status: None   Collection Time: 01/30/20 10:08 PM   Specimen: Urine, Random  Result Value Ref  Range Status   Specimen Description   Final    URINE, RANDOM Performed at [facility], [address] [place], [place] [address]    Special Requests   Final    NONE Performed at [facility], [address] [place], [place] [address]    Culture   Final    NO GROWTH Performed at [facility], [address] [place], [place] [address]    Report Status 02/01/2020 FINAL  Final      Radiology Studies: MR Brain W and Wo Contrast  Result Date: 01/30/2020 CLINICAL DATA:  Altered mental status and headaches. EXAM: MRI HEAD WITHOUT AND WITH CONTRAST TECHNIQUE: Multiplanar, multiecho pulse sequences of the brain and surrounding structures were obtained without and with intravenous contrast. CONTRAST:  62mL GADAVIST GADOBUTROL 1 MMOL/ML IV SOLN COMPARISON:  Head CT 01/28/2020 FINDINGS: Multiple sequences are mildly to moderately motion degraded. Brain: There is no evidence of an acute infarct, intracranial hemorrhage, mass, midline shift, or extra-axial fluid collection. T2 hyperintensities in the cerebral white matter bilaterally are nonspecific but compatible with mild chronic small vessel ischemic disease. There is mild to moderate generalized cerebral atrophy. No abnormal enhancement  is identified. Vascular: Major intracranial vascular flow voids are preserved. Skull and upper cervical spine: Unremarkable bone marrow signal. Sinuses/Orbits: Bilateral cataract extraction. Paranasal sinuses and mastoid air cells are clear. Other: None. IMPRESSION: 1. No acute intracranial abnormality. 2. Mild chronic small vessel ischemic disease. Electronically Signed   By: Sebastian Ache M.D.   On: 01/30/2020 17:24   DG CHEST PORT 1 VIEW  Result Date: 01/31/2020 CLINICAL DATA:  86 year old female with weakness and fever. Recently diagnosed with UTI. EXAM: PORTABLE CHEST 1 VIEW COMPARISON:  Chest radiographs 11/09/2010. FINDINGS: Portable AP semi upright view at 0557 hours. Normal lung  volumes and mediastinal contours. Visualized tracheal air column is within normal limits. Allowing for portable technique the lungs are clear. Negative visible bowel gas pattern. No acute osseous abnormality identified. IMPRESSION: Negative portable chest. Electronically Signed   By: Odessa Fleming M.D.   On: 01/31/2020 06:26       LOS: 0 days   Shanisha Lech Rito Ehrlich  [facility] Pager on [email or web address]  02/01/2020, 10:02 AM

## 2020-02-01 NOTE — Progress Notes (Signed)
Physical Therapy Treatment Patient Details Name: Stacy Glenn MRN: [id number] DOB: January 14, 1934 Today's Date: 02/01/2020    History of Present Illness 86 y.o. female with medical history significant of HTN, hypothyroidism. Presenting with generalized weakness and achiness. admitted with UTI worsening to pyelonephritis    PT Comments    Pt is progressing well with mobility, she ambulated 400' with RW, no loss of balance. Pt ambulates without an assistive device at baseline. Encouraged pt to use RW at home until she recovers her full strength. Instructed pt in BUE/LE strengthening exercises to be done independently to minimize deconditioning during hospitalization.    Follow Up Recommendations  Home health PT     Equipment Recommendations  None recommended by PT (pt reports she has cane and RW )    Recommendations for Other Services       Precautions / Restrictions Precautions Precautions: Fall Precaution Comments: pt walks without AD at baseline, denies h/o falls Restrictions Weight Bearing Restrictions: No    Mobility  Bed Mobility               General bed mobility comments: up in recliner  Transfers Overall transfer level: Needs assistance Equipment used: None Transfers: Sit to/from Stand Sit to Stand: Supervision         General transfer comment: VCs for hand placement  Ambulation/Gait Ambulation/Gait assistance: Supervision Gait Distance (Feet): 400 Feet Assistive device: Rolling walker (2 wheeled) Gait Pattern/deviations: Step-through pattern;Decreased stride length Gait velocity: WFL   General Gait Details: started ambulating with IV pole but noted pt was reaching for handrail with other hand so switched to RW, pt steady with RW with no loss of balance, encouraged pt to use RW for now until she recovers her full strength, VCs for positioning in RW   Stairs             Wheelchair Mobility    Modified Rankin (Stroke Patients Only)        Balance Overall balance assessment: Needs assistance (one fall in the last "few mos")   Sitting balance-Leahy Scale: Good       Standing balance-Leahy Scale: Fair                              Cognition Arousal/Alertness: Awake/alert Behavior During Therapy: WFL for tasks assessed/performed Overall Cognitive Status: Within Functional Limits for tasks assessed                                        Exercises General Exercises - Lower Extremity Ankle Circles/Pumps: AROM;Both;10 reps;Seated Long Arc Quad: AROM;Both;10 reps;Seated Hip Flexion/Marching: AROM;Both;10 reps;Seated Shoulder Exercises Pendulum Exercise: AROM;Both;10 reps;Seated    General Comments        Pertinent Vitals/Pain Pain Assessment: No/denies pain    Home Living                      Prior Function            PT Goals (current goals can now be found in the care plan section) Acute Rehab PT Goals Patient Stated Goal: gardening, yard work PT Goal Formulation: With patient Time For Goal Achievement: 02/14/20 Potential to Achieve Goals: Good Progress towards PT goals: Progressing toward goals    Frequency    Min 3X/week      PT Plan Current plan remains appropriate  Co-evaluation              AM-PAC PT "6 Clicks" Mobility   Outcome Measure  Help needed turning from your back to your side while in a flat bed without using bedrails?: A Little Help needed moving from lying on your back to sitting on the side of a flat bed without using bedrails?: A Little Help needed moving to and from a bed to a chair (including a wheelchair)?: None Help needed standing up from a chair using your arms (e.g., wheelchair or bedside chair)?: None Help needed to walk in hospital room?: None Help needed climbing 3-5 steps with a railing? : A Little 6 Click Score: 21    End of Session Equipment Utilized During Treatment: Gait belt Activity Tolerance: Patient  tolerated treatment well Patient left: with call bell/phone within reach;in chair (EOB, alarm not set on PT arrival)   PT Visit Diagnosis: Unsteadiness on feet (R26.81)     Time: 5643-3295 PT Time Calculation (min) (ACUTE ONLY): 26 min  Charges:  $Gait Training: 8-22 mins $Therapeutic Exercise: 8-22 mins                    Ralene Bathe Kistler PT 02/01/2020  Acute Rehabilitation Services Pager [phone] Office [phone]

## 2020-02-02 LAB — BASIC METABOLIC PANEL
Anion gap: 6 (ref 5–15)
BUN: 10 mg/dL (ref 8–23)
CO2: 25 mmol/L (ref 22–32)
Calcium: 8.6 mg/dL — ABNORMAL LOW (ref 8.9–10.3)
Chloride: 107 mmol/L (ref 98–111)
Creatinine, Ser: 0.97 mg/dL (ref 0.44–1.00)
GFR, Estimated: 58 mL/min — ABNORMAL LOW (ref >60)
Glucose, Bld: 127 mg/dL — ABNORMAL HIGH (ref 70–99)
Potassium: 4 mmol/L (ref 3.5–5.1)
Sodium: 138 mmol/L (ref 135–145)

## 2020-02-02 LAB — CBC
HCT: 29.7 % — ABNORMAL LOW (ref 36.0–46.0)
Hemoglobin: 9.8 g/dL — ABNORMAL LOW (ref 12.0–15.0)
MCH: 30.6 pg (ref 26.0–34.0)
MCHC: 33 g/dL (ref 30.0–36.0)
MCV: 92.8 fL (ref 80.0–100.0)
Platelets: 239 10*3/uL (ref 150–400)
RBC: 3.2 MIL/uL — ABNORMAL LOW (ref 3.87–5.11)
RDW: 13.8 % (ref 11.5–15.5)
WBC: 6.4 10*3/uL (ref 4.0–10.5)
nRBC: 0 % (ref 0.0–0.2)

## 2020-02-02 MED ORDER — LEVOTHYROXINE SODIUM 100 MCG PO TABS: 100.0000 ug | ORAL_TABLET | Freq: Every day | ORAL | 1 refills | Status: AC

## 2020-02-02 MED ORDER — FOSFOMYCIN TROMETHAMINE 3 G PO PACK
3.0000 g | PACK | Freq: Once | ORAL | Status: AC
  Administered 2020-02-02: 10:00:00 3 g via ORAL
  Filled 2020-02-02: qty 3

## 2020-02-02 MED ORDER — FOSFOMYCIN TROMETHAMINE 3 G PO PACK: 3.0000 g | PACK | Freq: Once | ORAL | 0 refills | Status: AC

## 2020-02-02 NOTE — Discharge Summary (Signed)
[facility]  Physician Discharge Summary   Patient ID: Stacy Glenn MRN: [id number] DOB/AGE: 06/19/1933 86 y.o.  Admit date: 01/30/2020 Discharge date: 02/02/2020  PCP: Charlane Ferretti, DO  DISCHARGE DIAGNOSES:  Urinary tract infection secondary to Enterococcus faecalis Physical deconditioning Hypothyroidism Essential hypertension Normocytic anemia  RECOMMENDATIONS FOR OUTPATIENT FOLLOW UP: 1. Follow-up with PCP in 1 week 2. Thyroid function test in 3 to 4 weeks. 3. Antihypertensives discontinued due to borderline low blood pressures.    Home Health: PT and OT Equipment/Devices: None  CODE STATUS: Full code  DISCHARGE CONDITION: fair  Diet recommendation: As before  INITIAL HISTORY: 86 y.o.femalewith medical history significant ofHTN, hypothyroidism. Presenting with generalized weakness and achiness. She reports her symptoms began several weeks ago. She was seen by a urologist at the time and was diagnosed with an enterococcus UTI. She was placed on abx, but she didn't seem to improve. She made multiple visits to multiple EDs over the next couple of weeks, but did not seem to have an improvement in her weakness and achiness. She saw her PCP who changed up her antibiotic regimen.  The admitting doctor spoke with her PCP, Dr. Thornell Mule. He noted that she seemed a little more confused and weak. He gave her rocephin and cipro; but she apparently did not improve over the next couple of days. She decided that she needed to come back to the ED. At [facility], MRI brain did not show any acute issue. Lab work was ok. UCx was collected and she was started on IV cipro with the presumption that she had a UTI worsening to pyelonephritis. TR[facility] was called for admission.    HOSPITAL COURSE:   Urinary tract infection secondary to Enterococcus this Patient with recent Enterococcus UTI which apparently has not improved despite outpatient management.  UA done here in the hospital does not  suggest infection.  However outside cultures have been positive for Enterococcus.     Looks like previously she has been on amoxicillin.  Subsequently given ciprofloxacin and a dose of ceftriaxone at her PCPs office.   Patient started on ceftriaxone and vancomycin.  Report of the urine culture was obtained from her urologist office.  Sensitivities reviewed.  Patient started feeling better as well.  Patient was given a dose of fosfomycin today.  Another dose will be repeated in 3 days.  Physical deconditioning/failure to thrive This is all thought to be a result of her UTI.  However her TSH was noted to be elevated with low free T3.  See below.  Seen by PT and OT who recommended home health.   MRI brain was done due to her weakness and does not show any acute finding.  Chest x-ray was also unremarkable.  Hypothyroidism Patient noted to be on 88 mcg of levothyroxine.  TSH noted to be elevated with low free T3.  We will increase the dose of levothyroxine for now.  She will need to have her thyroid function tests rechecked in the outpatient setting.  Essential hypertension Blood pressure noted to be borderline low during this hospital stay.  Would recommend discontinuing antihypertensives for now.    Normocytic anemia Hemoglobin stable.    No overt bleeding was noted.  Outpatient evaluation  Patient remains stable.  Wants to go home today.  Feels better.  Was given a dose of fosfomycin this morning which he tolerated well.  Okay for discharge.   PERTINENT LABS:  The results of significant diagnostics from this hospitalization (including imaging, microbiology, ancillary and  laboratory) are listed below for reference.    Microbiology: Recent Results (from the past 240 hour(s))  Resp Panel by RT-PCR (Flu A&B, Covid) Nasopharyngeal Swab     Status: None   Collection Time: 01/30/20  4:07 PM   Specimen: Nasopharyngeal Swab; Nasopharyngeal(NP) swabs in vial transport medium  Result Value Ref  Range Status   SARS Coronavirus 2 by RT PCR NEGATIVE NEGATIVE Final    Comment: (NOTE) SARS-CoV-2 target nucleic acids are NOT DETECTED.  The SARS-CoV-2 RNA is generally detectable in upper respiratory specimens during the acute phase of infection. The lowest concentration of SARS-CoV-2 viral copies this assay can detect is 138 copies/mL. A negative result does not preclude SARS-Cov-2 infection and should not be used as the sole basis for treatment or other patient management decisions. A negative result may occur with  improper specimen collection/handling, submission of specimen other than nasopharyngeal swab, presence of viral mutation(s) within the areas targeted by this assay, and inadequate number of viral copies(<138 copies/mL). A negative result must be combined with clinical observations, patient history, and epidemiological information. The expected result is Negative.  Fact Sheet for Patients:  BloggerCourse.com  Fact Sheet for Healthcare Providers:  SeriousBroker.it  This test is no t yet approved or cleared by the [place] FDA and  has been authorized for detection and/or diagnosis of SARS-CoV-2 by FDA under an Emergency Use Authorization (EUA). This EUA will remain  in effect (meaning this test can be used) for the duration of the COVID-19 declaration under Section 564(b)(1) of the Act, 21 U.S.C.section 360bbb-3(b)(1), unless the authorization is terminated  or revoked sooner.       Influenza A by PCR NEGATIVE NEGATIVE Final   Influenza B by PCR NEGATIVE NEGATIVE Final    Comment: (NOTE) The Xpert Xpress SARS-CoV-2/FLU/RSV plus assay is intended as an aid in the diagnosis of influenza from Nasopharyngeal swab specimens and should not be used as a sole basis for treatment. Nasal washings and aspirates are unacceptable for Xpert Xpress SARS-CoV-2/FLU/RSV testing.  Fact Sheet for  Patients: BloggerCourse.com  Fact Sheet for Healthcare Providers: SeriousBroker.it  This test is not yet approved or cleared by the [place] FDA and has been authorized for detection and/or diagnosis of SARS-CoV-2 by FDA under an Emergency Use Authorization (EUA). This EUA will remain in effect (meaning this test can be used) for the duration of the COVID-19 declaration under Section 564(b)(1) of the Act, 21 U.S.C. section 360bbb-3(b)(1), unless the authorization is terminated or revoked.  Performed at [facility], [address] [place], [place] [address]   Blood culture (routine x 2)     Status: None (Preliminary result)   Collection Time: 01/30/20  7:43 PM   Specimen: BLOOD LEFT ARM  Result Value Ref Range Status   Specimen Description   Final    BLOOD LEFT ARM Performed at [facility], [address] [place], [place] [address]    Special Requests   Final    BOTTLES DRAWN AEROBIC ONLY Blood Culture results may not be optimal due to an inadequate volume of blood received in culture bottles Performed at [facility], [address] [place], [place] [address]    Culture   Final    NO GROWTH 3 DAYS Performed at [facility], [address] [place], [place] [address]    Report Status PENDING  Incomplete  Blood culture (routine x 2)     Status: None (  Preliminary result)   Collection Time: 01/30/20  7:44 PM   Specimen: BLOOD LEFT ARM  Result Value Ref Range Status   Specimen Description   Final    BLOOD LEFT ARM Performed at [facility], [address] [place], [place] [address]    Special Requests   Final    BOTTLES DRAWN AEROBIC AND ANAEROBIC Blood Culture results may not be optimal due to an inadequate volume of blood received in culture bottles Performed at [facility], [address] [place], [place] [address]    Culture   Final    NO GROWTH 3  DAYS Performed at [facility], [address], [place], [place] [address]    Report Status PENDING  Incomplete  Urine culture     Status: None   Collection Time: 01/30/20 10:08 PM   Specimen: Urine, Random  Result Value Ref Range Status   Specimen Description   Final    URINE, RANDOM Performed at [facility], [address] [place], [place] [address]    Special Requests   Final    NONE Performed at [facility], [address] [place], [place] [address]    Culture   Final    NO GROWTH Performed at [facility], [address] [place], [place] [address]    Report Status 02/01/2020 FINAL  Final     Labs:  COVID-19 Labs   Lab Results  Component Value Date   SARSCOV2NAA NEGATIVE 01/30/2020      Basic Metabolic Panel: Recent Labs  Lab 01/30/20 1606 01/31/20 0903 01/31/20 0948 02/01/20 0543 02/02/20 0446  NA 133* 135  --  139 138  K 3.7 3.7  --  3.5 4.0  CL 96* 99  --  106 107  CO2 26 25  --  25 25  GLUCOSE 100* 99  --  95 127*  BUN 17 15  --  15 10  CREATININE 0.97 0.92 0.79 0.95 0.97  CALCIUM 8.8* 8.7*  --  8.3* 8.6*  MG  --  2.2  --   --   --    Liver Function Tests: Recent Labs  Lab 01/30/20 1606 01/31/20 0903 02/01/20 0543  AST 26 24 22   ALT 23 24 23   ALKPHOS 37* 36* 32*  BILITOT 0.7 0.9 0.8  PROT 6.8 6.7 5.7*  ALBUMIN 3.9 3.7 3.3*    Recent Labs  Lab 01/30/20 1606  AMMONIA <9*   CBC: Recent Labs  Lab 01/30/20 1606 01/31/20 0903 01/31/20 0948 02/01/20 0543 02/02/20 0446  WBC 7.7 8.2 7.6 7.8 6.4  NEUTROABS 5.8 5.8  --   --   --   HGB 10.8* 10.7* 9.9* 9.8* 9.8*  HCT 31.8* 32.5* 29.5* 29.7* 29.7*  MCV 91.4 92.6 92.2 94.0 92.8  PLT 227 257 215 234 239     IMAGING STUDIES CT HEAD WO CONTRAST  Result Date: 01/28/2020 CLINICAL DATA:  Headache and dizziness EXAM: CT HEAD WITHOUT CONTRAST TECHNIQUE: Contiguous axial images were obtained from the base of the skull through the vertex without  intravenous contrast. COMPARISON:  None. FINDINGS: Brain: No acute intracranial hemorrhage. No focal mass lesion. No CT evidence of acute infarction. No midline shift or mass effect. No hydrocephalus. Basilar cisterns are patent. There are periventricular and subcortical white matter hypodensities. Generalized cortical atrophy. Vascular: No hyperdense vessel or unexpected calcification. Skull: Normal. Negative for fracture or focal lesion. Sinuses/Orbits: Paranasal sinuses  and mastoid air cells are clear. Orbits are clear. Other: None. IMPRESSION: 1. No acute intracranial findings. 2. Atrophy and white matter microvascular disease. Electronically Signed   By: Genevive Bi M.D.   On: 01/28/2020 11:54   MR Brain W and Wo Contrast  Result Date: 01/30/2020 CLINICAL DATA:  Altered mental status and headaches. EXAM: MRI HEAD WITHOUT AND WITH CONTRAST TECHNIQUE: Multiplanar, multiecho pulse sequences of the brain and surrounding structures were obtained without and with intravenous contrast. CONTRAST:  7mL GADAVIST GADOBUTROL 1 MMOL/ML IV SOLN COMPARISON:  Head CT 01/28/2020 FINDINGS: Multiple sequences are mildly to moderately motion degraded. Brain: There is no evidence of an acute infarct, intracranial hemorrhage, mass, midline shift, or extra-axial fluid collection. T2 hyperintensities in the cerebral white matter bilaterally are nonspecific but compatible with mild chronic small vessel ischemic disease. There is mild to moderate generalized cerebral atrophy. No abnormal enhancement is identified. Vascular: Major intracranial vascular flow voids are preserved. Skull and upper cervical spine: Unremarkable bone marrow signal. Sinuses/Orbits: Bilateral cataract extraction. Paranasal sinuses and mastoid air cells are clear. Other: None. IMPRESSION: 1. No acute intracranial abnormality. 2. Mild chronic small vessel ischemic disease. Electronically Signed   By: Sebastian Ache M.D.   On: 01/30/2020 17:24   DG CHEST  PORT 1 VIEW  Result Date: 01/31/2020 CLINICAL DATA:  86 year old female with weakness and fever. Recently diagnosed with UTI. EXAM: PORTABLE CHEST 1 VIEW COMPARISON:  Chest radiographs 11/09/2010. FINDINGS: Portable AP semi upright view at 0557 hours. Normal lung volumes and mediastinal contours. Visualized tracheal air column is within normal limits. Allowing for portable technique the lungs are clear. Negative visible bowel gas pattern. No acute osseous abnormality identified. IMPRESSION: Negative portable chest. Electronically Signed   By: Odessa Fleming M.D.   On: 01/31/2020 06:26    DISCHARGE EXAMINATION: Vitals:   02/01/20 2152 02/02/20 0440 02/02/20 0609 02/02/20 0951  BP: (!) 129/54 (!) 114/43 (!) 126/56 (!) 94/49  Pulse: 65 65 63 63  Resp: Temp: 98.6 F (37 C) 98.1 F (36.7 C)  99 F (37.2 C)  TempSrc: Oral Oral  Oral  SpO2: 97% 96%  99%  Weight:      Height:       General appearance: Awake alert.  In no distress Resp: Clear to auscultation bilaterally.  Normal effort Cardio: S1-S2 is normal regular.  No S3-S4.  No rubs murmurs or bruit GI: Abdomen is soft.  Nontender nondistended.  Bowel sounds are present normal.  No masses organomegaly     DISPOSITION: Home  Discharge Instructions    Call MD for:  difficulty breathing, headache or visual disturbances   Complete by: As directed    Call MD for:  extreme fatigue   Complete by: As directed    Call MD for:  hives   Complete by: As directed    Call MD for:  persistant dizziness or light-headedness   Complete by: As directed    Call MD for:  persistant nausea and vomiting   Complete by: As directed    Call MD for:  severe uncontrolled pain   Complete by: As directed    Call MD for:  temperature >100.4   Complete by: As directed    Diet - low sodium heart healthy   Complete by: As directed    Discharge instructions   Complete by: As directed    Please take your medications as prescribed.  Follow-up with your  primary care provider next  week.  Seek attention if your symptoms were to recur.  Hold your blood pressure medication for now.  You were cared for by a hospitalist during your hospital stay. If you have any questions about your discharge medications or the care you received while you were in the hospital after you are discharged, you can call the unit and asked to speak with the hospitalist on call if the hospitalist that took care of you is not available. Once you are discharged, your primary care physician will handle any further medical issues. Please note that NO REFILLS for any discharge medications will be authorized once you are discharged, as it is imperative that you return to your primary care physician (or establish a relationship with a primary care physician if you do not have one) for your aftercare needs so that they can reassess your need for medications and monitor your lab values. If you do not have a primary care physician, you can call [phone] for a physician referral.   Increase activity slowly   Complete by: As directed        Allergies as of 02/02/2020   No Known Allergies     Medication List    STOP taking these medications   amoxicillin 500 MG tablet Commonly known as: AMOXIL   ciprofloxacin 500 MG tablet Commonly known as: CIPRO   losartan-hydrochlorothiazide 100-25 MG tablet Commonly known as: HYZAAR     TAKE these medications   fosfomycin 3 g Pack Commonly known as: MONUROL Take 3 g by mouth once for 1 dose. On 02/05/2020 Start taking on: February 05, 2020   [facility] GM/SCOOP powder Generic drug: polyethylene glycol powder Take 17 g by mouth daily as needed for constipation.   levothyroxine 100 MCG tablet Commonly known as: SYNTHROID Take 1 tablet (100 mcg total) by mouth daily before breakfast. What changed:   medication strength  how much to take   multivitamin with minerals Tabs tablet Take 1 tablet by mouth daily.   polyvinyl alcohol  1.4 % ophthalmic solution Commonly known as: LIQUIFILM TEARS Place 1 drop into both eyes as needed for dry eyes.         Follow-up Information    [facility] Follow up.   Why: agency will provide home health physical therapy. Contact information: [address] [place] [address] [phone]        Charlane FerrettiSkakle, [place], DO. Schedule an appointment as soon as possible for a visit in 1 week(s).   Specialty: Internal Medicine Contact information: [address] [place] [address] [phone]               TOTAL DISCHARGE TIME: 35 minutes  Ahlam Piscitelli Rito EhrlichKrishnan  [facility] Pager on [email or web address]  02/02/2020, 2:05 PM

## 2020-02-02 NOTE — TOC Transition Note (Signed)
Transition of Care [facility]) - CM/SW Discharge Note   Patient Details  Name: Stacy Glenn MRN: [id number] Date of Birth: Apr 11, 1933  [facility] of [facility] CM/SW Contact:  Amada Jupiter, LCSW Phone Number: 02/02/2020, 1:08 PM   Clinical Narrative:    Pt medically cleared for dc today.  Have alerted [facility] to HHPT and OT orders.  No further TOC needs.   Final next level of care: Home w Home Health Services Barriers to Discharge: Barriers Resolved   Patient Goals and CMS Choice Patient states their goals for this hospitalization and ongoing recovery are:: to go home CMS Medicare.gov Compare Post Acute Care list provided to:: Patient Choice offered to / list presented to : Patient  Discharge Placement                       Discharge Plan and Services   Discharge Planning Services: CM Consult Post Acute Care Choice: Home Health                    HH Arranged: PT,OT [facility] Agency: [facility] Date [facility] Agency Contacted: 02/01/20 Time HH Agency Contacted: [phone] Representative spoke with at [facility] Agency: Becky Sax  Social Determinants of Health (SDOH) Interventions     Readmission Risk Interventions No flowsheet data found.

## 2020-02-02 NOTE — Progress Notes (Signed)
Discharge instructions given to patient and all questions were answered.  

## 2020-02-02 NOTE — Discharge Instructions (Signed)
Pyelonephritis, Adult  Pyelonephritis is an infection that occurs in the kidney. The kidneys are organs that help clean the blood by moving waste out of the blood and into the pee (urine). This infection can happen quickly, or it can last for a long time. In most cases, it clears up with treatment and does not cause other problems. What are the causes? This condition may be caused by:  Germs (bacteria) going from the bladder up to the kidney. This may happen after having a bladder infection.  Germs going from the blood to the kidney. What increases the risk? This condition is more likely to develop in:  Pregnant women.  Older people.  People who have any of these conditions: ? Diabetes. ? Inflammation of the prostate gland (prostatitis), in males. ? Kidney stones or bladder stones. ? Other problems with the kidney or the parts of your body that carry pee from the kidneys to the bladder (ureters). ? Cancer.  People who have a small, thin tube (catheter) placed in the bladder.  People who are sexually active.  Women who use a medicine that kills sperm (spermicide) to prevent pregnancy.  People who have had a prior urinary tract infection (UTI). What are the signs or symptoms? Symptoms of this condition include:  Peeing often.  A strong urge to pee right away.  Burning or stinging when peeing.  Belly pain.  Back pain.  Pain in the side (flank area).  Fever or chills.  Blood in the pee, or dark pee.  Feeling sick to your stomach (nauseous) or throwing up (vomiting). How is this treated? This condition may be treated by:  Taking antibiotic medicines by mouth (orally).  Drinking enough fluids. If the infection is bad, you may need to stay in the hospital. You may be given antibiotics and fluids that are put directly into a vein through an IV tube. In some cases, other treatments may be needed. Follow these instructions at home: Medicines  Take your antibiotic  medicine as told by your doctor. Do not stop taking the antibiotic even if you start to feel better.  Take over-the-counter and prescription medicines only as told by your doctor. General instructions   Drink enough fluid to keep your pee pale yellow.  Avoid caffeine, tea, and carbonated drinks.  Pee (urinate) often. Avoid holding in pee for long periods of time.  Pee before and after sex.  After pooping (having a bowel movement), women should wipe from front to back. Use each tissue only once.  Keep all follow-up visits as told by your doctor. This is important. Contact a doctor if:  You do not feel better after 2 days.  Your symptoms get worse.  You have a fever. Get help right away if:  You cannot take your medicine or drink fluids as told.  You have chills and shaking.  You throw up.  You have very bad pain in your side or back.  You feel very weak or you pass out (faint). Summary  Pyelonephritis is an infection that occurs in the kidney.  In most cases, this infection clears up with treatment and does not cause other problems.  Take your antibiotic medicine as told by your doctor. Do not stop taking the antibiotic even if you start to feel better.  Drink enough fluid to keep your pee pale yellow. This information is not intended to replace advice given to you by your health care provider. Make sure you discuss any questions you have with   your health care provider. Document Revised: 11/25/2017 Document Reviewed: 11/25/2017 Elsevier Patient Education  2020 [employer] Inc.  

## 2020-02-04 LAB — CULTURE, BLOOD (ROUTINE X 2)
Culture: NO GROWTH
Culture: NO GROWTH

## 2020-02-11 ENCOUNTER — Ambulatory Visit: Payer: Medicare Other | Admitting: Podiatry

## 2020-03-08 ENCOUNTER — Other Ambulatory Visit: Payer: Self-pay | Admitting: Internal Medicine

## 2020-03-17 ENCOUNTER — Encounter: Payer: Self-pay | Admitting: Podiatry

## 2020-03-17 ENCOUNTER — Other Ambulatory Visit: Payer: Self-pay

## 2020-03-17 ENCOUNTER — Ambulatory Visit: Payer: Medicare Other | Admitting: Podiatry

## 2020-03-17 DIAGNOSIS — B351 Tinea unguium: Secondary | ICD-10-CM | POA: Diagnosis not present

## 2020-03-17 DIAGNOSIS — M79674 Pain in right toe(s): Secondary | ICD-10-CM

## 2020-03-17 DIAGNOSIS — M79675 Pain in left toe(s): Secondary | ICD-10-CM

## 2020-03-17 DIAGNOSIS — Q828 Other specified congenital malformations of skin: Secondary | ICD-10-CM

## 2020-03-17 NOTE — Progress Notes (Signed)
This patient presents to the office for evaluation and treatment of her feet.  She says her big toenails have grown thick and painful.  These nails are painful wearing her shoes.  She also has a painful callus under the ball of her right foot.  This has become painful walking and wearing her shoes.  She presents to the office for preventative foot care services.  General Appearance  Alert, conversant and in no acute stress.  Vascular  Dorsalis pedis  are palpable  Bilaterally.  Posterior tibial pulses are weakly palpable.    Capillary return is within normal limits  bilaterally. Temperature is within normal limits  bilaterally.  Neurologic  Senn-Weinstein monofilament wire test within normal limits  bilaterally. Muscle power within normal limits bilaterally.  Nails Thick disfigured discolored nails with subungual debris  from hallux to fifth toes bilaterally. No evidence of bacterial infection or drainage bilaterally.  Orthopedic  No limitations of motion  feet .  No crepitus or effusions noted.  No bony pathology or digital deformities noted.  Plantarflexed second metatarsal head right foot.  Contracted digits  B/L.  Skin  normotropic skin with no porokeratosis noted bilaterally.  No signs of infections or ulcers noted.    Onychomycosis  Porokeratosis  Right forefoot.  Debride nails with nail nipper and dremel tool.  Debride porokeratosis with # 15 blade.  Dispense padding.  Gave dispersion sheet.  RTC 3 months.  Helane Gunther DPM

## 2020-04-14 ENCOUNTER — Other Ambulatory Visit: Payer: Self-pay | Admitting: Internal Medicine

## 2020-04-14 DIAGNOSIS — M8580 Other specified disorders of bone density and structure, unspecified site: Secondary | ICD-10-CM

## 2020-04-17 ENCOUNTER — Inpatient Hospital Stay: Admission: RE | Admit: 2020-04-17 | Payer: Medicare Other | Source: Ambulatory Visit

## 2020-04-17 ENCOUNTER — Other Ambulatory Visit: Payer: Medicare Other

## 2020-05-03 ENCOUNTER — Other Ambulatory Visit: Payer: Self-pay

## 2020-05-03 ENCOUNTER — Ambulatory Visit
Admission: RE | Admit: 2020-05-03 | Discharge: 2020-05-03 | Disposition: A | Discharge status: Home / Self Care | Payer: Medicare Other | Source: Ambulatory Visit | Attending: Internal Medicine | Admitting: Internal Medicine

## 2020-05-03 DIAGNOSIS — Z1231 Encounter for screening mammogram for malignant neoplasm of breast: Secondary | ICD-10-CM

## 2020-06-14 ENCOUNTER — Ambulatory Visit: Payer: Medicare Other | Admitting: Podiatry

## 2020-06-15 ENCOUNTER — Other Ambulatory Visit: Payer: Self-pay | Admitting: Urology

## 2020-08-03 NOTE — Patient Instructions (Addendum)
DUE TO COVID-19 ONLY ONE VISITOR IS ALLOWED TO COME WITH YOU AND STAY IN THE WAITING ROOM ONLY DURING PRE OP AND PROCEDURE DAY OF SURGERY. THE 1 VISITOR  MAY VISIT WITH YOU AFTER SURGERY IN YOUR PRIVATE ROOM DURING VISITING HOURS ONLY!  YOU NEED TO HAVE A COVID 19 TEST ON: 08/14/20 @ 10:00 AM,THIS TEST MUST BE DONE BEFORE SURGERY,  COVID TESTING SITE [address] [place] [address], IT IS ON THE RIGHT GOING OUT [address] APPROXIMATELY  2 MINUTES PAST [employer] ON THE RIGHT. ONCE YOUR COVID TEST IS COMPLETED,  PLEASE BEGIN THE QUARANTINE INSTRUCTIONS AS OUTLINED IN YOUR HANDOUT.                Stacy Glenn   Your procedure is scheduled on: 08/16/20   Report to [facility] Main  Entrance   Report to admitting at : 6:30 AM     Call this number if you have problems the morning of surgery [phone]    Remember: Do not eat solid food :After Midnight. Clear liquids until: 5:30 am.  CLEAR LIQUID DIET  Foods Allowed                                                                     Foods Excluded  Coffee and tea, regular and decaf                             liquids that you cannot  Plain Jell-O any favor except red or purple                                           see through such as: Fruit ices (not with fruit pulp)                                     milk, soups, orange juice  Iced Popsicles                                    All solid food Carbonated beverages, regular and diet                                    Cranberry, grape and apple juices Sports drinks like Gatorade Lightly seasoned clear broth or consume(fat free) Sugar, honey syrup  Sample Menu Breakfast                                Lunch                                     Supper Cranberry juice  Beef broth                            Chicken broth Jell-O                                     Grape juice                           Apple juice Coffee or tea                         Jell-O                                      Popsicle                                                Coffee or tea                        Coffee or tea  _____________________________________________________________________   BRUSH YOUR TEETH MORNING OF SURGERY AND RINSE YOUR MOUTH OUT, NO CHEWING GUM CANDY OR MINTS.    Take these medicines the morning of surgery with A SIP OF WATER: synthroid.                               You may not have any metal on your body including hair pins and              piercings  Do not wear jewelry, make-up, lotions, powders or perfumes, deodorant             Do not wear nail polish on your fingernails.  Do not shave  48 hours prior to surgery.    Do not bring valuables to the hospital. [facility] IS NOT             RESPONSIBLE   FOR VALUABLES.  Contacts, dentures or bridgework may not be worn into surgery.  Leave suitcase in the car. After surgery it may be brought to your room.     Patients discharged the day of surgery will not be allowed to drive home. IF YOU ARE HAVING SURGERY AND GOING HOME THE SAME DAY, YOU MUST HAVE AN ADULT TO DRIVE YOU HOME AND BE WITH YOU FOR 24 HOURS. YOU MAY GO HOME BY TAXI OR UBER OR ORTHERWISE, BUT AN ADULT MUST ACCOMPANY YOU HOME AND STAY WITH YOU FOR 24 HOURS.  Name and phone number of your driver:  Special Instructions: N/A              Please read over the following fact sheets you were given: _____________________________________________________________________           [facility] - Preparing for Surgery Before surgery, you can play an important role.  Because skin is not sterile, your skin needs to be as free of germs as possible.  You can reduce the number of germs on your skin by washing with CHG (chlorahexidine gluconate) soap before surgery.  CHG is  an antiseptic cleaner which kills germs and bonds with the skin to continue killing germs even after washing. Please DO NOT use if you have an allergy to  CHG or antibacterial soaps.  If your skin becomes reddened/irritated stop using the CHG and inform your nurse when you arrive at Short Stay. Do not shave (including legs and underarms) for at least 48 hours prior to the first CHG shower.  You may shave your face/neck. Please follow these instructions carefully:  1.  Shower with CHG Soap the night before surgery and the  morning of Surgery.  2.  If you choose to wash your hair, wash your hair first as usual with your  normal  shampoo.  3.  After you shampoo, rinse your hair and body thoroughly to remove the  shampoo.                           4.  Use CHG as you would any other liquid soap.  You can apply chg directly  to the skin and wash                       Gently with a scrungie or clean washcloth.  5.  Apply the CHG Soap to your body ONLY FROM THE NECK DOWN.   Do not use on face/ open                           Wound or open sores. Avoid contact with eyes, ears mouth and genitals (private parts).                       Wash face,  Genitals (private parts) with your normal soap.             6.  Wash thoroughly, paying special attention to the area where your surgery  will be performed.  7.  Thoroughly rinse your body with warm water from the neck down.  8.  DO NOT shower/wash with your normal soap after using and rinsing off  the CHG Soap.                9.  Pat yourself dry with a clean towel.            10.  Wear clean pajamas.            11.  Place clean sheets on your bed the night of your first shower and do not  sleep with pets. Day of Surgery : Do not apply any lotions/deodorants the morning of surgery.  Please wear clean clothes to the hospital/surgery center.  FAILURE TO FOLLOW THESE INSTRUCTIONS MAY RESULT IN THE CANCELLATION OF YOUR SURGERY PATIENT SIGNATURE_________________________________  NURSE SIGNATURE__________________________________  ________________________________________________________________________

## 2020-08-08 ENCOUNTER — Other Ambulatory Visit: Payer: Self-pay

## 2020-08-08 ENCOUNTER — Encounter (HOSPITAL_COMMUNITY)
Admission: RE | Admit: 2020-08-08 | Discharge: 2020-08-08 | Disposition: A | Discharge status: Home / Self Care | Payer: Medicare Other | Source: Ambulatory Visit | Attending: Urology | Admitting: Urology

## 2020-08-08 ENCOUNTER — Encounter (HOSPITAL_COMMUNITY): Payer: Self-pay

## 2020-08-08 DIAGNOSIS — Z01812 Encounter for preprocedural laboratory examination: Secondary | ICD-10-CM | POA: Insufficient documentation

## 2020-08-08 LAB — COMPREHENSIVE METABOLIC PANEL
ALT: 20 U/L (ref 0–44)
AST: 24 U/L (ref 15–41)
Albumin: 4.3 g/dL (ref 3.5–5.0)
Alkaline Phosphatase: 63 U/L (ref 38–126)
Anion gap: 6 (ref 5–15)
BUN: 24 mg/dL — ABNORMAL HIGH (ref 8–23)
CO2: 30 mmol/L (ref 22–32)
Calcium: 9.7 mg/dL (ref 8.9–10.3)
Chloride: 107 mmol/L (ref 98–111)
Creatinine, Ser: 0.82 mg/dL (ref 0.44–1.00)
GFR, Estimated: >60 mL/min (ref >60)
Glucose, Bld: 102 mg/dL — ABNORMAL HIGH (ref 70–99)
Potassium: 4.3 mmol/L (ref 3.5–5.1)
Sodium: 143 mmol/L (ref 135–145)
Total Bilirubin: 0.6 mg/dL (ref 0.3–1.2)
Total Protein: 7.4 g/dL (ref 6.5–8.1)

## 2020-08-08 LAB — CBC
HCT: 36 % (ref 36.0–46.0)
Hemoglobin: 11.5 g/dL — ABNORMAL LOW (ref 12.0–15.0)
MCH: 30.5 pg (ref 26.0–34.0)
MCHC: 31.9 g/dL (ref 30.0–36.0)
MCV: 95.5 fL (ref 80.0–100.0)
Platelets: 225 10*3/uL (ref 150–400)
RBC: 3.77 MIL/uL — ABNORMAL LOW (ref 3.87–5.11)
RDW: 14 % (ref 11.5–15.5)
WBC: 6.1 10*3/uL (ref 4.0–10.5)
nRBC: 0 % (ref 0.0–0.2)

## 2020-08-08 NOTE — Progress Notes (Signed)
COVID Vaccine Completed:Yes Date COVID Vaccine completed: 12/21/19 COVID vaccine manufacturer:    Moderna     PCP - DO: Charlane Ferretti Cardiologist -   Chest x-ray -  EKG -  Stress Test -  ECHO -  Cardiac Cath -  Pacemaker/ICD device last checked:  Sleep Study -  CPAP -   Fasting Blood Sugar -  Checks Blood Sugar _____ times a day  Blood Thinner Instructions: Aspirin Instructions: Last Dose:  Anesthesia review:   Patient denies shortness of breath, fever, cough and chest pain at PAT appointment   Patient verbalized understanding of instructions that were given to them at the PAT appointment. Patient was also instructed that they will need to review over the PAT instructions again at home before surgery.

## 2020-08-09 LAB — URINE CULTURE: Culture: 20000 — AB

## 2020-08-10 NOTE — Progress Notes (Signed)
    Urine Culture results:  20,000 COLONIES/mL GROUP B STREP(S.AGALACTIAE)ISOLATED  TESTING AGAINST S. AGALACTIAE NOT ROUTINELY PERFORMED DUE TO PREDICTABILITY OF AMP/PEN/VAN SUSCEPTIBILITY.  Performed at [facility], [address] [place], [place] [address]

## 2020-08-14 ENCOUNTER — Other Ambulatory Visit (HOSPITAL_COMMUNITY)
Admission: RE | Admit: 2020-08-14 | Discharge: 2020-08-14 | Disposition: A | Discharge status: Home / Self Care | Payer: Medicare Other | Source: Ambulatory Visit | Attending: Urology | Admitting: Urology

## 2020-08-14 DIAGNOSIS — Z20822 Contact with and (suspected) exposure to covid-19: Secondary | ICD-10-CM | POA: Diagnosis not present

## 2020-08-14 DIAGNOSIS — Z01812 Encounter for preprocedural laboratory examination: Secondary | ICD-10-CM | POA: Insufficient documentation

## 2020-08-15 LAB — SARS CORONAVIRUS 2 (TAT 6-24 HRS): SARS Coronavirus 2: NEGATIVE

## 2020-08-15 NOTE — Anesthesia Preprocedure Evaluation (Addendum)
Anesthesia Evaluation  Patient identified by MRN, date of birth, ID band Patient awake    Reviewed: Allergy & Precautions, NPO status , Patient's Chart, lab work & pertinent test results  Airway Mallampati: II  TM Distance: >3 FB Neck ROM: Full    Dental no notable dental hx.    Pulmonary neg pulmonary ROS,    Pulmonary exam normal breath sounds clear to auscultation       Cardiovascular hypertension, Normal cardiovascular exam Rhythm:Regular Rate:Normal     Neuro/Psych negative neurological ROS  negative psych ROS   GI/Hepatic negative GI ROS, Neg liver ROS,   Endo/Other  Hypothyroidism   Renal/GU negative Renal ROS  negative genitourinary   Musculoskeletal negative musculoskeletal ROS (+)   Abdominal   Peds negative pediatric ROS (+)  Hematology negative hematology ROS (+)   Anesthesia Other Findings   Reproductive/Obstetrics negative OB ROS                            Anesthesia Physical Anesthesia Plan  ASA: 2  Anesthesia Plan: General   Post-op Pain Management:    Induction: Intravenous  PONV Risk Score and Plan: 3 and Ondansetron, Dexamethasone and Treatment may vary due to age or medical condition  Airway Management Planned: Oral ETT  Additional Equipment:   Intra-op Plan:   Post-operative Plan: Extubation in OR  Informed Consent: I have reviewed the patients History and Physical, chart, labs and discussed the procedure including the risks, benefits and alternatives for the proposed anesthesia with the patient or authorized representative who has indicated his/her understanding and acceptance.     Dental advisory given  Plan Discussed with: CRNA and Surgeon  Anesthesia Plan Comments:         Anesthesia Quick Evaluation

## 2020-08-16 ENCOUNTER — Observation Stay (HOSPITAL_COMMUNITY)
Admission: RE | Admit: 2020-08-16 | Discharge: 2020-08-17 | Disposition: A | Discharge status: Home / Self Care | Payer: Medicare Other | Source: Ambulatory Visit | Attending: Urology | Admitting: Urology

## 2020-08-16 ENCOUNTER — Ambulatory Visit (HOSPITAL_COMMUNITY): Payer: Medicare Other | Admitting: Certified Registered"

## 2020-08-16 ENCOUNTER — Encounter (HOSPITAL_COMMUNITY): Payer: Self-pay | Admitting: Urology

## 2020-08-16 ENCOUNTER — Encounter (HOSPITAL_COMMUNITY)
Admission: RE | Disposition: A | Discharge status: Home / Self Care | Payer: Self-pay | Source: Ambulatory Visit | Attending: Urology

## 2020-08-16 ENCOUNTER — Other Ambulatory Visit: Payer: Self-pay

## 2020-08-16 DIAGNOSIS — N814 Uterovaginal prolapse, unspecified: Secondary | ICD-10-CM | POA: Diagnosis present

## 2020-08-16 DIAGNOSIS — E039 Hypothyroidism, unspecified: Secondary | ICD-10-CM | POA: Insufficient documentation

## 2020-08-16 DIAGNOSIS — K4020 Bilateral inguinal hernia, without obstruction or gangrene, not specified as recurrent: Secondary | ICD-10-CM | POA: Insufficient documentation

## 2020-08-16 DIAGNOSIS — I10 Essential (primary) hypertension: Secondary | ICD-10-CM | POA: Diagnosis not present

## 2020-08-16 DIAGNOSIS — Z79899 Other long term (current) drug therapy: Secondary | ICD-10-CM | POA: Diagnosis not present

## 2020-08-16 HISTORY — PX: ROBOTIC ASSISTED LAPAROSCOPIC SACROCOLPOPEXY: SHX5388

## 2020-08-16 HISTORY — PX: XI ROBOTIC ASSISTED INGUINAL HERNIA REPAIR WITH MESH: SHX6706

## 2020-08-16 HISTORY — PX: ROBOTIC ASSISTED LAPAROSCOPIC HYSTERECTOMY AND SALPINGECTOMY: SHX6379

## 2020-08-16 LAB — TYPE AND SCREEN
ABO/RH(D): A POS
Antibody Screen: NEGATIVE

## 2020-08-16 LAB — HEMOGLOBIN AND HEMATOCRIT, BLOOD
HCT: 34.9 % — ABNORMAL LOW (ref 36.0–46.0)
Hemoglobin: 10.7 g/dL — ABNORMAL LOW (ref 12.0–15.0)

## 2020-08-16 LAB — ABO/RH: ABO/RH(D): A POS

## 2020-08-16 SURGERY — SACROCOLPOPEXY, ROBOT-ASSISTED, LAPAROSCOPIC
Anesthesia: General | Site: Abdomen

## 2020-08-16 MED ORDER — STERILE WATER FOR IRRIGATION IR SOLN
Status: DC | PRN
  Administered 2020-08-16: 1000 mL

## 2020-08-16 MED ORDER — ALBUMIN HUMAN 5 % IV SOLN
INTRAVENOUS | Status: AC
  Filled 2020-08-16: qty 250

## 2020-08-16 MED ORDER — ACETAMINOPHEN 10 MG/ML IV SOLN: 1000.0000 mg | Freq: Once | INTRAVENOUS | Status: DC | PRN

## 2020-08-16 MED ORDER — TRAMADOL HCL 50 MG PO TABS: 50.0000 mg | ORAL_TABLET | Freq: Four times a day (QID) | ORAL | 0 refills | Status: AC | PRN

## 2020-08-16 MED ORDER — ROCURONIUM BROMIDE 10 MG/ML (PF) SYRINGE
PREFILLED_SYRINGE | INTRAVENOUS | Status: DC | PRN
  Administered 2020-08-16: 40 mg via INTRAVENOUS
  Administered 2020-08-16 (×3): 20 mg via INTRAVENOUS
  Administered 2020-08-16: 10 mg via INTRAVENOUS
  Administered 2020-08-16: 60 mg via INTRAVENOUS

## 2020-08-16 MED ORDER — SODIUM CHLORIDE (PF) 0.9 % IJ SOLN
INTRAMUSCULAR | Status: AC
  Filled 2020-08-16: qty 10

## 2020-08-16 MED ORDER — LACTATED RINGERS IV SOLN: INTRAVENOUS | Status: DC

## 2020-08-16 MED ORDER — SODIUM CHLORIDE 0.9 % IV SOLN
2.0000 g | INTRAVENOUS | Status: AC
  Administered 2020-08-16: 2 g via INTRAVENOUS
  Filled 2020-08-16: qty 2

## 2020-08-16 MED ORDER — ONDANSETRON HCL 4 MG/2ML IJ SOLN
INTRAMUSCULAR | Status: AC
  Filled 2020-08-16: qty 2

## 2020-08-16 MED ORDER — CEFAZOLIN SODIUM-DEXTROSE 1-4 GM/50ML-% IV SOLN
1.0000 g | Freq: Three times a day (TID) | INTRAVENOUS | Status: AC
  Administered 2020-08-16 – 2020-08-17 (×2): 1 g via INTRAVENOUS
  Filled 2020-08-16 (×2): qty 50

## 2020-08-16 MED ORDER — ROCURONIUM BROMIDE 10 MG/ML (PF) SYRINGE
PREFILLED_SYRINGE | INTRAVENOUS | Status: AC
  Filled 2020-08-16: qty 10

## 2020-08-16 MED ORDER — DOCUSATE SODIUM 100 MG PO CAPS: 100.0000 mg | ORAL_CAPSULE | Freq: Two times a day (BID) | ORAL | Status: AC

## 2020-08-16 MED ORDER — MORPHINE SULFATE (PF) 2 MG/ML IV SOLN: 2.0000 mg | INTRAVENOUS | Status: AC | PRN

## 2020-08-16 MED ORDER — PROPOFOL 10 MG/ML IV BOLUS
INTRAVENOUS | Status: DC | PRN
  Administered 2020-08-16: 120 mg via INTRAVENOUS

## 2020-08-16 MED ORDER — CHLORHEXIDINE GLUCONATE 0.12 % MT SOLN
15.0000 mL | Freq: Once | OROMUCOSAL | Status: AC
  Administered 2020-08-16: 15 mL via OROMUCOSAL

## 2020-08-16 MED ORDER — HYDROMORPHONE HCL 1 MG/ML IJ SOLN
0.2500 mg | INTRAMUSCULAR | Status: DC | PRN
  Administered 2020-08-16: 0.5 mg via INTRAVENOUS

## 2020-08-16 MED ORDER — LEVOTHYROXINE SODIUM 100 MCG PO TABS
100.0000 ug | ORAL_TABLET | Freq: Every day | ORAL | Status: DC
  Administered 2020-08-17: 100 ug via ORAL
  Filled 2020-08-16: qty 1

## 2020-08-16 MED ORDER — OXYCODONE HCL 5 MG PO TABS: 5.0000 mg | ORAL_TABLET | Freq: Once | ORAL | Status: DC | PRN

## 2020-08-16 MED ORDER — PROPOFOL 10 MG/ML IV BOLUS
INTRAVENOUS | Status: AC
  Filled 2020-08-16: qty 20

## 2020-08-16 MED ORDER — LIDOCAINE 2% (20 MG/ML) 5 ML SYRINGE
INTRAMUSCULAR | Status: AC
  Filled 2020-08-16: qty 5

## 2020-08-16 MED ORDER — KETOROLAC TROMETHAMINE 0.5 % OP SOLN
1.0000 [drp] | Freq: Four times a day (QID) | OPHTHALMIC | Status: DC
  Administered 2020-08-16 – 2020-08-17 (×2): 1 [drp] via OPHTHALMIC
  Filled 2020-08-16: qty 5

## 2020-08-16 MED ORDER — BUPIVACAINE-EPINEPHRINE 0.5% -1:200000 IJ SOLN
INTRAMUSCULAR | Status: DC | PRN
  Administered 2020-08-16: 30 mL

## 2020-08-16 MED ORDER — HYDROMORPHONE HCL 1 MG/ML IJ SOLN
INTRAMUSCULAR | Status: AC
  Filled 2020-08-16: qty 1

## 2020-08-16 MED ORDER — ACETAMINOPHEN 10 MG/ML IV SOLN
1000.0000 mg | Freq: Four times a day (QID) | INTRAVENOUS | Status: DC
  Administered 2020-08-16 – 2020-08-17 (×3): 1000 mg via INTRAVENOUS
  Filled 2020-08-16 (×3): qty 100

## 2020-08-16 MED ORDER — SODIUM CHLORIDE (PF) 0.9 % IJ SOLN
INTRAMUSCULAR | Status: AC
  Filled 2020-08-16: qty 20

## 2020-08-16 MED ORDER — ALBUMIN HUMAN 5 % IV SOLN: INTRAVENOUS | Status: DC | PRN

## 2020-08-16 MED ORDER — ORAL CARE MOUTH RINSE: 15.0000 mL | Freq: Once | OROMUCOSAL | Status: AC

## 2020-08-16 MED ORDER — DEXTROSE-NACL 5-0.45 % IV SOLN: INTRAVENOUS | Status: DC

## 2020-08-16 MED ORDER — OXYCODONE HCL 5 MG PO TABS
ORAL_TABLET | ORAL | Status: AC
  Filled 2020-08-16: qty 1

## 2020-08-16 MED ORDER — ONDANSETRON HCL 4 MG/2ML IJ SOLN: 4.0000 mg | INTRAMUSCULAR | Status: DC | PRN

## 2020-08-16 MED ORDER — SODIUM CHLORIDE (PF) 0.9 % IJ SOLN
INTRAMUSCULAR | Status: DC | PRN
  Administered 2020-08-16: 20 mL via INTRAVENOUS

## 2020-08-16 MED ORDER — ONDANSETRON HCL 4 MG/2ML IJ SOLN: 4.0000 mg | Freq: Once | INTRAMUSCULAR | Status: DC | PRN

## 2020-08-16 MED ORDER — CHLORHEXIDINE GLUCONATE CLOTH 2 % EX PADS
6.0000 | MEDICATED_PAD | Freq: Every day | CUTANEOUS | Status: DC
  Administered 2020-08-16: 6 via TOPICAL

## 2020-08-16 MED ORDER — BOOST / RESOURCE BREEZE PO LIQD CUSTOM: 1.0000 | Freq: Three times a day (TID) | ORAL | Status: DC

## 2020-08-16 MED ORDER — BUPIVACAINE LIPOSOME 1.3 % IJ SUSP
20.0000 mL | Freq: Once | INTRAMUSCULAR | Status: AC
  Administered 2020-08-16: 20 mL
  Filled 2020-08-16: qty 20

## 2020-08-16 MED ORDER — FENTANYL CITRATE (PF) 250 MCG/5ML IJ SOLN
INTRAMUSCULAR | Status: DC | PRN
  Administered 2020-08-16 (×9): 50 ug via INTRAVENOUS

## 2020-08-16 MED ORDER — LACTATED RINGERS IR SOLN
Status: DC | PRN
  Administered 2020-08-16: 1000 mL

## 2020-08-16 MED ORDER — ESTRADIOL 0.1 MG/GM VA CREA
TOPICAL_CREAM | VAGINAL | Status: AC
  Filled 2020-08-16: qty 42.5

## 2020-08-16 MED ORDER — FENTANYL CITRATE (PF) 250 MCG/5ML IJ SOLN
INTRAMUSCULAR | Status: AC
  Filled 2020-08-16: qty 5

## 2020-08-16 MED ORDER — LIDOCAINE 20MG/ML (2%) 15 ML SYRINGE OPTIME
INTRAMUSCULAR | Status: DC | PRN
  Administered 2020-08-16: 1.5 mg/kg/h via INTRAVENOUS

## 2020-08-16 MED ORDER — SUGAMMADEX SODIUM 200 MG/2ML IV SOLN
INTRAVENOUS | Status: DC | PRN
  Administered 2020-08-16: 200 mg via INTRAVENOUS

## 2020-08-16 MED ORDER — ESTRADIOL 0.1 MG/GM VA CREA
TOPICAL_CREAM | VAGINAL | Status: DC | PRN
  Administered 2020-08-16: 1 via VAGINAL

## 2020-08-16 MED ORDER — OXYCODONE HCL 5 MG/5ML PO SOLN: 5.0000 mg | Freq: Once | ORAL | Status: DC | PRN

## 2020-08-16 MED ORDER — ONDANSETRON HCL 4 MG/2ML IJ SOLN
INTRAMUSCULAR | Status: DC | PRN
  Administered 2020-08-16: 4 mg via INTRAVENOUS

## 2020-08-16 MED ORDER — SODIUM CHLORIDE 0.9 % IV SOLN
INTRAVENOUS | Status: AC
  Filled 2020-08-16: qty 2

## 2020-08-16 MED ORDER — BUPIVACAINE-EPINEPHRINE (PF) 0.5% -1:200000 IJ SOLN
INTRAMUSCULAR | Status: AC
  Filled 2020-08-16: qty 30

## 2020-08-16 MED ORDER — KETOROLAC TROMETHAMINE 15 MG/ML IJ SOLN
15.0000 mg | Freq: Four times a day (QID) | INTRAMUSCULAR | Status: DC
  Administered 2020-08-16 – 2020-08-17 (×3): 15 mg via INTRAVENOUS
  Filled 2020-08-16 (×3): qty 1

## 2020-08-16 MED ORDER — HYDROMORPHONE HCL 1 MG/ML IJ SOLN
INTRAMUSCULAR | Status: AC
  Administered 2020-08-16: 0.5 mg via INTRAVENOUS
  Filled 2020-08-16: qty 1

## 2020-08-16 MED ORDER — TRAMADOL HCL 50 MG PO TABS
50.0000 mg | ORAL_TABLET | Freq: Four times a day (QID) | ORAL | Status: DC | PRN
  Administered 2020-08-17: 50 mg via ORAL
  Filled 2020-08-16: qty 1

## 2020-08-16 MED ORDER — LIDOCAINE 2% (20 MG/ML) 5 ML SYRINGE
INTRAMUSCULAR | Status: DC | PRN
  Administered 2020-08-16: 60 mg via INTRAVENOUS

## 2020-08-16 MED ORDER — DEXAMETHASONE SODIUM PHOSPHATE 10 MG/ML IJ SOLN
INTRAMUSCULAR | Status: DC | PRN
  Administered 2020-08-16: 5 mg via INTRAVENOUS

## 2020-08-16 MED ORDER — DEXAMETHASONE SODIUM PHOSPHATE 10 MG/ML IJ SOLN
INTRAMUSCULAR | Status: AC
  Filled 2020-08-16: qty 1

## 2020-08-16 SURGICAL SUPPLY — 96 items
APPLICATOR COTTON TIP 6 STRL (MISCELLANEOUS) ×4 IMPLANT
APPLICATOR COTTON TIP 6IN STRL (MISCELLANEOUS) ×6
BAG COUNTER SPONGE SURGICOUNT (BAG) IMPLANT
BAG LAPAROSCOPIC 12 15 PORT 16 (BASKET) ×2 IMPLANT
BAG RETRIEVAL 12/15 (BASKET) ×3
BAG URINE DRAIN 2000ML AR STRL (UROLOGICAL SUPPLIES) IMPLANT
BLADE SURG SZ11 CARB STEEL (BLADE) IMPLANT
BRIEF STRETCH FOR OB PAD LRG (UNDERPADS AND DIAPERS) IMPLANT
CATH FOLEY 2WAY SLVR  5CC 16FR (CATHETERS) ×3
CATH FOLEY 2WAY SLVR 5CC 16FR (CATHETERS) ×2 IMPLANT
CHLORAPREP W/TINT 26 (MISCELLANEOUS) ×6 IMPLANT
CLIP VESOLOCK LG 6/CT PURPLE (CLIP) ×3 IMPLANT
CLIP VESOLOCK MED LG 6/CT (CLIP) ×3 IMPLANT
CLIP VESOLOCK XL 6/CT (CLIP) ×3 IMPLANT
COVER SURGICAL LIGHT HANDLE (MISCELLANEOUS) ×6 IMPLANT
COVER TIP SHEARS 8 DVNC (MISCELLANEOUS) ×4 IMPLANT
COVER TIP SHEARS 8MM DA VINCI (MISCELLANEOUS) ×6
DECANTER SPIKE VIAL GLASS SM (MISCELLANEOUS) ×3 IMPLANT
DERMABOND ADVANCED (GAUZE/BANDAGES/DRESSINGS) ×1
DERMABOND ADVANCED .7 DNX12 (GAUZE/BANDAGES/DRESSINGS) ×2 IMPLANT
DILATOR CANAL MILEX (MISCELLANEOUS) ×3 IMPLANT
DRAIN CHANNEL RND F F (WOUND CARE) IMPLANT
DRAPE ARM DVNC X/XI (DISPOSABLE) ×8 IMPLANT
DRAPE COLUMN DVNC XI (DISPOSABLE) ×2 IMPLANT
DRAPE DA VINCI XI ARM (DISPOSABLE) ×12
DRAPE DA VINCI XI COLUMN (DISPOSABLE) ×3
DRAPE INCISE IOBAN 66X45 STRL (DRAPES) ×3 IMPLANT
DRAPE SHEET LG 3/4 BI-LAMINATE (DRAPES) ×3 IMPLANT
DRAPE SURG IRRIG POUCH 19X23 (DRAPES) ×3 IMPLANT
ELECT PENCIL ROCKER SW 15FT (MISCELLANEOUS) ×3 IMPLANT
ELECT REM PT RETURN 15FT ADLT (MISCELLANEOUS) ×6 IMPLANT
GAUZE 4X4 16PLY ~~LOC~~+RFID DBL (SPONGE) IMPLANT
GLOVE SURG ENC MOIS LTX SZ6 (GLOVE) IMPLANT
GLOVE SURG ENC MOIS LTX SZ6.5 (GLOVE) ×12 IMPLANT
GLOVE SURG ENC TEXT LTX SZ7.5 (GLOVE) ×15 IMPLANT
GLOVE SURG MICRO LTX SZ6 (GLOVE) IMPLANT
GLOVE SURG UNDER LTX SZ6.5 (GLOVE) ×12 IMPLANT
GLOVE SURG UNDER POLY LF SZ7 (GLOVE) ×6 IMPLANT
GOWN STRL REUS W/TWL LRG LVL3 (GOWN DISPOSABLE) ×15 IMPLANT
GOWN STRL REUS W/TWL XL LVL3 (GOWN DISPOSABLE) ×15 IMPLANT
HOLDER FOLEY CATH W/STRAP (MISCELLANEOUS) ×3 IMPLANT
IRRIG SUCT STRYKERFLOW 2 WTIP (MISCELLANEOUS) ×3
IRRIGATION SUCT STRKRFLW 2 WTP (MISCELLANEOUS) ×2 IMPLANT
KIT BASIN OR (CUSTOM PROCEDURE TRAY) ×3 IMPLANT
KIT TURNOVER KIT A (KITS) ×3 IMPLANT
LUBRICANT JELLY K Y 4OZ (MISCELLANEOUS) ×3 IMPLANT
MANIPULATOR UTERINE 4.5 ZUMI (MISCELLANEOUS) IMPLANT
MARKER SKIN DUAL TIP RULER LAB (MISCELLANEOUS) ×3 IMPLANT
MESH 3DMAX 4X6 LT LRG (Mesh General) ×3 IMPLANT
MESH 3DMAX 4X6 RT LRG (Mesh General) ×3 IMPLANT
MESH Y UPSYLON VAGINAL (Mesh General) ×3 IMPLANT
NEEDLE HYPO 22GX1.5 SAFETY (NEEDLE) ×3 IMPLANT
NEEDLE INSUFFLATION 14GA 120MM (NEEDLE) IMPLANT
OCCLUDER COLPOPNEUMO (BALLOONS) ×3 IMPLANT
PACK CARDIOVASCULAR III (CUSTOM PROCEDURE TRAY) IMPLANT
PACKING VAGINAL (PACKING) ×3 IMPLANT
PAD OB MATERNITY 4.3X12.25 (PERSONAL CARE ITEMS) IMPLANT
PAD POSITIONING PINK XL (MISCELLANEOUS) ×6 IMPLANT
POUCH SPECIMEN RETRIEVAL 10MM (ENDOMECHANICALS) IMPLANT
SEAL CANN UNIV 5-8 DVNC XI (MISCELLANEOUS) ×14 IMPLANT
SEAL XI 5MM-8MM UNIVERSAL (MISCELLANEOUS) ×21
SET IRRIG TUBING LAPAROSCOPIC (IRRIGATION / IRRIGATOR) IMPLANT
SET IRRIG Y TYPE TUR BLADDER L (SET/KITS/TRAYS/PACK) IMPLANT
SET TUBE SMOKE EVAC HIGH FLOW (TUBING) ×3 IMPLANT
SHEET LAVH (DRAPES) ×3 IMPLANT
SOL ANTI FOG 6CC (MISCELLANEOUS) ×2 IMPLANT
SOL PREP POV-IOD 4OZ 10% (MISCELLANEOUS) IMPLANT
SOLUTION ANTI FOG 6CC (MISCELLANEOUS) ×1
SOLUTION ELECTROLUBE (MISCELLANEOUS) ×6 IMPLANT
SPONGE T-LAP 18X18 ~~LOC~~+RFID (SPONGE) ×3 IMPLANT
SURGILUBE 2OZ TUBE FLIPTOP (MISCELLANEOUS) IMPLANT
SUT ETHIBOND 0 36 GRN (SUTURE) ×3 IMPLANT
SUT MNCRL AB 4-0 PS2 18 (SUTURE) ×6 IMPLANT
SUT PROLENE 2 0 CT 1 (SUTURE) ×6 IMPLANT
SUT PROLENE 4 0 PS 2 18 (SUTURE) ×6 IMPLANT
SUT VIC AB 0 CT1 27 (SUTURE) ×3
SUT VIC AB 0 CT1 27XBRD ANTBC (SUTURE) ×2 IMPLANT
SUT VIC AB 2-0 SH 27 (SUTURE) ×15
SUT VIC AB 2-0 SH 27XBRD (SUTURE) ×10 IMPLANT
SUT VIC AB 3-0 SH 27 (SUTURE) ×12
SUT VIC AB 3-0 SH 27X BRD (SUTURE) ×4 IMPLANT
SUT VIC AB 3-0 SH 27XBRD (SUTURE) ×4 IMPLANT
SUT VICRYL 0 UR6 27IN ABS (SUTURE) ×6 IMPLANT
SUT VLOC 3-0 9IN GRN (SUTURE) ×6 IMPLANT
SYR 10ML LL (SYRINGE) ×3 IMPLANT
SYR 20ML LL LF (SYRINGE) ×3 IMPLANT
SYR 50ML LL SCALE MARK (SYRINGE) IMPLANT
SYR BULB IRRIG 60ML STRL (SYRINGE) IMPLANT
TOWEL OR 17X26 10 PK STRL BLUE (TOWEL DISPOSABLE) ×6 IMPLANT
TOWEL OR NON WOVEN STRL DISP B (DISPOSABLE) ×3 IMPLANT
TRAY LAPAROSCOPIC (CUSTOM PROCEDURE TRAY) ×3 IMPLANT
TROCAR ENDOPATH XCEL 12X100 BL (ENDOMECHANICALS) IMPLANT
TROCAR XCEL 12X100 BLDLESS (ENDOMECHANICALS) ×3 IMPLANT
TROCAR XCEL NON-BLD 5MMX100MML (ENDOMECHANICALS) ×3 IMPLANT
TUBING INSUFFLATION 10FT LAP (TUBING) ×3 IMPLANT
WATER STERILE IRR 1000ML POUR (IV SOLUTION) ×3 IMPLANT

## 2020-08-16 NOTE — Anesthesia Procedure Notes (Signed)
Procedure Name: Intubation Date/Time: 08/16/2020 8:42 AM Performed by: Elyn Peers, CRNA Pre-anesthesia Checklist: Patient identified, Emergency Drugs available, Suction available, Patient being monitored and Timeout performed Patient Re-evaluated:Patient Re-evaluated prior to induction Oxygen Delivery Method: Circle system utilized Preoxygenation: Pre-oxygenation with 100% oxygen Induction Type: IV induction Ventilation: Mask ventilation without difficulty Laryngoscope Size: Miller and 3 Grade View: Grade I Tube type: Oral Tube size: 7.0 mm Number of attempts: 1 Airway Equipment and Method: Stylet Placement Confirmation: ETT inserted through vocal cords under direct vision, positive ETCO2 and breath sounds checked- equal and bilateral Secured at: 22 cm Tube secured with: Tape Dental Injury: Teeth and Oropharynx as per pre-operative assessment

## 2020-08-16 NOTE — Transfer of Care (Signed)
Immediate Anesthesia Transfer of Care Note  Patient: Stacy Glenn  Procedure(s) Performed: XI ROBOTIC ASSISTED LAPAROSCOPIC SACROCOLPOPEXY (Bilateral: Abdomen) XI ROBOTIC ASSISTED LAPAROSCOPIC HYSTERECTOMY AND SALPINGECTOMY (Abdomen) XI ROBOTIC ASSISTED INGUINAL HERNIA REPAIR WITH MESH (Bilateral: Abdomen)  Patient Location: PACU  Anesthesia Type:General  Level of Consciousness: awake, drowsy and responds to stimulation  Airway & Oxygen Therapy: Patient Spontanous Breathing and Patient connected to face mask oxygen  Post-op Assessment: Report given to RN and Post -op Vital signs reviewed and stable  Post vital signs: Reviewed and stable  Last Vitals:  Vitals Value Taken Time  BP 156/75 08/16/20 1426  Temp    Pulse    Resp 20 08/16/20 1428  SpO2    Vitals shown include unvalidated device data.  Last Pain:  Vitals:   08/16/20 0650  TempSrc:   PainSc: 0-No pain         Complications: No notable events documented.

## 2020-08-16 NOTE — H&P (Signed)
Pelvic organ prolapse  HPI: Stacy Glenn is a 87 year-old female established patient who is here for further evaluation of her pelvic prolapse.  Using a pessary for her prolapsed over the last two years. Having pain from pessary when constipated. Pessary managed by Dr. Vincente Poli. Having stress incontinence as well.   Noted to have Grade 4 prolapsed of uterus and bladder two years ago with DR. Tannenbaum. Has had UDS as well. Was evaluated for hematuria by Dr. Patsi Sears in 2018 and it was negative.   Interval: Today the patient is seen for urgent follow-up. We saw her several times this fall for infections. She ultimately ended up in the hospital over Christmas with failure to thrive. Her urine cultured the time was negative, but she had just been previously treated for an Enterococcus UTI. The patient has had symptomatic prolapse now for fiber 6 years. She this was managed successfully for several years with a pessary. At this time she is no longer interested in the past renal like surgery.   In addition to the patient's prolapse she was also found to have a left inguinal hernia.   Interval: Today the patient follows up for discussion. She is undergone a urodynamic evaluation. This demonstrated a fairly small capacity bladder with instability hyperactivity. There is no stress urinary incontinence. She was able to empty her bladder completely. The patient complains of discomfort and urinary frequency when her bladder is prolapse. She is unable to do the any physical activity without it prolapsing down. She is having significant discomfort.   08/08/2020: 87 year old female who presents today for preoperative appointment prior to undergoing surgery on 08/16/2020. She denies interval UTIs, changes to her health history or changes to her medication. She denies chest pain and shortness of breath.   She first noticed her pelvic prolapse approximately 08/05/2015. She does not have pelvic pain. Her symptoms have  been stable over the last year.   She does not have an abnormal sensation when she needs to urinate. She is not urinating more frequently now than usual. She does have a good size and strength to her urinary stream. She is having problems with emptying her bladder well.   She is having problems with urinary control or incontinence. She does wear protective pads. She wears 2-3 pads per day. She can get to the bathroom in time when she gets the urge to urinate. She does leak urine when she coughs, laughs, sneezes or bears down. The patient does not have a history of recurrent UTIs.   The patient does posture when she voids or defecates. The patient does not have trouble with constipation.   The patient has no history of pelvic floor surgery - she maintains her uterus.     ALLERGIES: None   MEDICATIONS: Nitrofurantoin 50 mg capsule 1 capsule PO Q HS  Calcium  Levothyroxine 100 mcg capsule  Multivitamin  Preservision Areds     GU PSH: Complex cystometrogram, w/ void pressure and urethral pressure profile studies, any technique - 02/29/2020, 2018 Complex Uroflow - 02/29/2020, 2018 Cystoscopy - 2018 Emg surf Electrd - 02/29/2020, 2018 Inject For cystogram - 02/29/2020, 2018 Intrabd voidng Press - 02/29/2020, 2018 Locm 300-399Mg /Ml Iodine,1Ml - 2018     NON-GU PSH: None   GU PMH: Female genital prolapse, unspecified - 03/13/2020, - 02/15/2020, - 01/04/2020, - 2020, - 2018, - 2018, - 2018 Stress Incontinence - 02/29/2020, - 2018 Urge incontinence - 02/29/2020, - 2018 Acute Cystitis/UTI - 02/15/2020, - 01/17/2020, - 01/04/2020 Unil Inguinal  Hernia W/O obst or gang,non-recurrent - 02/15/2020 Gross hematuria - 01/17/2020, - 01/04/2020, - 2018 Nocturnal Enuresis - 2018    NON-GU PMH: Cerebral infarction, unspecified Hypertension Hypothyroidism    FAMILY HISTORY: Alzheimer's Disease - Mother Deceased - Father, Mother Heart Disease - Mother, Father, Brother nephrolithiasis - Brother   SOCIAL  HISTORY: Marital Status: Married Preferred Language: English; Ethnicity: Not Hispanic Or Latino; Race: White Current Smoking Status: Patient has never smoked.   Tobacco Use Assessment Completed: Used Tobacco in last 30 days? Has never drank.  Drinks 1 caffeinated drink per day. Patient's occupation is/was Retired.     Notes: 1 son   REVIEW OF SYSTEMS:    GU Review Female:   Patient denies frequent urination, hard to postpone urination, burning /pain with urination, get up at night to urinate, leakage of urine, stream starts and stops, trouble starting your stream, have to strain to urinate, and being pregnant.  Gastrointestinal (Upper):   Patient denies nausea, vomiting, and indigestion/ heartburn.  Gastrointestinal (Lower):   Patient denies diarrhea and constipation.  Constitutional:   Patient denies fever, night sweats, weight loss, and fatigue.  Cardiovascular:   Patient denies leg swelling and chest pains.  Respiratory:   Patient denies cough and shortness of breath.  Musculoskeletal:   Patient denies back pain and joint pain.  Neurological:   Patient denies headaches and dizziness.  Psychologic:   Patient denies depression and anxiety.   VITAL SIGNS:      08/08/2020 02:08 PM  Weight 150 lb / 68.04 kg  Height 64.5 in / 163.83 cm  BP 117/65 mmHg  Pulse 69 /min  Temperature 97.7 F / 36.5 C  BMI 25.3 kg/m   MULTI-SYSTEM PHYSICAL EXAMINATION:    Constitutional: Well-nourished. No physical deformities. Normally developed. Good grooming.  Neck: Neck symmetrical, not swollen. Normal tracheal position.  Respiratory: No labored breathing, no use of accessory muscles.   Cardiovascular: Normal temperature, normal extremity pulses, no swelling, no varicosities.  Lymphatic: No enlargement of neck, axillae, groin.  Skin: No paleness, no jaundice, no cyanosis. No lesion, no ulcer, no rash.  Neurologic / Psychiatric: Oriented to time, oriented to place, oriented to person. No depression,  no anxiety, no agitation.  Gastrointestinal: No mass, no tenderness, no rigidity, non obese abdomen.  Eyes: Normal conjunctivae. Normal eyelids.  Ears, Nose, Mouth, and Throat: Left ear no scars, no lesions, no masses. Right ear no scars, no lesions, no masses. Nose no scars, no lesions, no masses. Normal hearing. Normal lips.  Musculoskeletal: Normal gait and station of head and neck.     Complexity of Data:  Source Of History:  Patient, Medical Record Summary  Records Review:   Previous Doctor Records, Previous Patient Records  Urine Test Review:   Urinalysis   08/08/20  Urinalysis  Urine Appearance Clear   Urine Color Yellow   Urine Glucose Neg mg/dL  Urine Bilirubin Neg mg/dL  Urine Ketones Neg mg/dL  Urine Specific Gravity 1.020   Urine Blood Neg ery/uL  Urine pH <=5.0   Urine Protein Neg mg/dL  Urine Urobilinogen 0.2 mg/dL  Urine Nitrites Neg   Urine Leukocyte Esterase Neg leu/uL   PROCEDURES:          Urinalysis Dipstick Dipstick Cont'd  Color: Yellow Bilirubin: Neg mg/dL  Appearance: Clear Ketones: Neg mg/dL  Specific Gravity: 9.741 Blood: Neg ery/uL  pH: <=5.0 Protein: Neg mg/dL  Glucose: Neg mg/dL Urobilinogen: 0.2 mg/dL    Nitrites: Neg    Leukocyte Esterase: Neg  leu/uL    ASSESSMENT:      ICD-10 Details  1 GU:   Female genital prolapse, unspecified - N81.9 Chronic, Stable   PLAN:           Orders Labs CULTURE, URINE          Document Letter(s):  Created for Patient: Clinical Summary         Notes:   Urinalysis was sent for precautionary culture today due to upcoming surgical procedure. All of her questions were answered to the best of my ability. At this point. I do not see any reason why she cannot move forward with sacrocolpopexy on 08/16/2020 she understands to call the office with any changes to her health status in the interval.

## 2020-08-16 NOTE — Interval H&P Note (Signed)
History and Physical Interval Note:  08/16/2020 8:05 AM  Stacy Glenn  has presented today for surgery, with the diagnosis of PELVIC ORGAN PROLAPSE.  The various methods of treatment have been discussed with the patient and family. After consideration of risks, benefits and other options for treatment, the patient has consented to  Procedure(s): XI ROBOTIC ASSISTED LAPAROSCOPIC SACROCOLPOPEXY (Bilateral) XI ROBOTIC ASSISTED LAPAROSCOPIC HYSTERECTOMY AND SALPINGECTOMY (N/A) XI ROBOTIC ASSISTED INGUINAL HERNIA REPAIR WITH MESH (Left) as a surgical intervention.  The patient's history has been reviewed, patient examined, no change in status, stable for surgery.  I have reviewed the patient's chart and labs.  Questions were answered to the patient's satisfaction.     Crist Fat

## 2020-08-16 NOTE — Op Note (Signed)
Preoperative diagnosis:   Pelvic organ prolapse  Postoperative diagnosis:   Same  Procedure: Robotic-assisted laparoscopic supracervical hysterectomy and  bilateral salpingo-oopherectomy Robotic-assisted laparoscopic sacrocolpopexy  Surgeon: Crist Fat, MD First assistant: Harrie Foreman, PA-C Resident surgeon: Yousef Abu-sahla  An assistant was required for this surgical procedure.  The duties of the assistant included but were not limited to suctioning, passing suture, camera manipulation, retraction. This procedure would not be able to be performed without an Geophysicist/field seismologist.   Anesthesia: General  Complications: None  Intraoperative findings:  [employer] Upsilon Y mesh used for the sacrocolpopexy.  EBL: 160 mL  Specimens: Supracervical hysterectomy and salpingo-ectomy  Indication: Stacy Glenn is a 87 y.o. female patient with symptomatic pelvic organ prolapse.    After reviewing the management options for treatment, she elected to proceed with the above surgical procedure(s). We have discussed the potential benefits and risks of the procedure, side effects of the proposed treatment, the likelihood of the patient achieving the goals of the procedure, and any potential problems that might occur during the procedure or recuperation. Informed consent has been obtained.  Description of procedure:  The patient was taken to the operating room and general anesthesia was induced.  The patient was placed in the dorsal lithotomy position, prepped and draped in the usual sterile fashion, and preoperative antibiotics were administered. A preoperative time-out was performed.    A Foley catheter was then placed and placed to gravity drainage. I then made a periumbilical incision carrying the dissection down to the patient's fascia with electrocautery.  Once to the fascia, the fascia was incised and a small puncture hole made in the peritoneum to allow passage of a 6mm port.   The  abdomen was insufflated and the remaining ports placed under digital guidance.  2 ports were placed lateral to the umbilicus on the right proximally 10 cm apart.  The most lateral port was approximately 3 cm above the anterior iliac spine.  2 additional ports were placed in the patient's right side in comparable positions to the most lateral port on the right was a 12 mm port.the robot was then docked at an angle from the leg obliquely along the side of the left leg.  Dr. Dossie Der began by doing bilateral inguinal hernias.  Please see his dictation for further details.  We then began our surgery by cleaning up some of the pelvic adhesions to the small bowel and colon.  Once this was completed I started dissecting at the sacral promontory located 3 cm medial to the location where the ureter crosses over the iliac vessels at the pelvic brim. The posterior peritoneum was incised and the sacral prominence cleared off an area taking care to avoid the middle sacral vessels and the iliac branches.  I then created a posterior peritoneal tunnel starting at the sacral promontory and tunneling down the right pelvic sidewall down into the pelvis breaking back through the posterior peritoneum around the vesico-vaginal junction posteriorly.  I then continued the posterior dissection retracting down on the rectum and finding the avascular plane between the posterior vaginal wall and the rectum.  I carried this dissection down as far as I could to along the area of the perineal body.  Then focused my attention to the uterus and hysterectomy.  I first started by taking the right round ligament with a series of by polar cautery.  I then dissected the anterior leaf of the broad ligament slightly more proximal and then distally down across the  anterior mucosa salpinx and the internal cervical os.  I then took of the uterine ovarian ligament on the right and dissected free the right salpinx. Once the anterior leaf of the broad  ligament had been completely dissected on the patient's right and a small bladder flap had been created anteriorly attention was turned to the left side where a similar dissection was carried out. I then turned my attention to the anterior plane between the anterior vaginal wall and the bladder.  I was able to obtain access to the avascular plane and with a combination of both monopolar cautery and blunt dissection was able to clean and nice down to the bladder neck.  I then turned my attention back to the patient's uterus and skeletonized the right uterine artery and vein and then took this with a series of bipolar moves.  I then performed a similar uterus pedicle ligation on the left.  This point I was able to identify the patient's cervix and came through supracervical with monopolar cautery once the uterus was freed from all its attachments it was pushed into the left paracolic gutter and our attention was turned to placing the wire mesh.  Mesh was measured at approximately 7 cm anteriorly and 7 cm posteriorly and I cut this on the back table.  The mesh was then placed into the patient's abdomen through the assistant port and the anterior leaf was secured down onto the anterior vaginal wall with the apex at the bladder neck.  The posterior leaf was then secured down on the posterior vaginal wall.  These were sewn down with 2-0 Vicryl.  Between 6 and 8 were done on each side.  At this point I then went back to the previously dissected sacral promontory and posterior peritoneal tunnel and inserted a instrument through the tunnel and grasped the end of the mesh at the vaginal cuff and pull it up to the sacrum.  I then checked to ensure that the sacral mesh was not too tight by performing a vaginal exam.  I then secured the sacral leg of the mesh using a 0 Prolene.  I then reapproximated the posterior peritoneum with a 2-0 Vicryl in a running fashion around the sacral promontory.  The pelvic peritoneum was closed  using a pursestring.  A small Endo Catch bag was then gently passed through the assistant port and the uterus was placed in the bag.  The bag was then brought out through the camera port once the trochars were removed.  We then made a slightly larger extraction incision to remove the uterus.  The fascia was then closed with 0 Vicryl in a figure-of-eight fashion.  The skin was closed with 4-0 Monocryl's.  Dermabond was applied to the incision and exparel injected into the incisions.  The patient was subsequently extubated and returned to the PACU in excellent condition.

## 2020-08-16 NOTE — Discharge Instructions (Signed)

## 2020-08-16 NOTE — Op Note (Signed)
Patient: Stacy Glenn (Oct 09, 1933, [id number])  Date of Surgery: 08/16/2020   Preoperative Diagnosis: PELVIC ORGAN PROLAPSE, LEFT INGUINAL HERNIA  Postoperative Diagnosis: PELVIC ORGAN PROLAPSE, BILATERAL INGUINAL HERNIAS  Surgical Procedure: Panel 1 XI ROBOTIC ASSISTED LAPAROSCOPIC SACROCOLPOPEXY: 94854 (CPT) XI ROBOTIC ASSISTED LAPAROSCOPIC HYSTERECTOMY AND SALPINGECTOMY:  Panel 2 XI ROBOTIC ASSISTED BILATERAL INGUINAL HERNIA REPAIR WITH MESH:    Operative Team Members:  Surgeon(s) and Role: Panel 1:    * Crist Fat, MD - Primary Panel 2:    * Shiela Bruns, Hyman Hopes, MD - Primary   Anesthesiologist: Eilene Ghazi, MD CRNA: Elyn Peers, CRNA; Florene Route, CRNA; Sudie Grumbling, CRNA   Anesthesia: General   Fluids:  Total I/O In: 2462 [I.V.:2112; IV Piggyback:350] Out: 450 [Urine:200; Blood:250]  Complications: None  Drains:  none   Specimen:  ID Type Source Tests Collected by Time Destination  1 : uterus, cervix and bilateral tubes and ovaries Tissue PATH Gyn benign resection SURGICAL PATHOLOGY Crist Fat, MD 08/16/2020 1044      Disposition:  PACU - hemodynamically stable.  Plan of Care: Admit for overnight observation    Indications for Procedure: Stacy Glenn is a 87 y.o. female who presented with pelvic organ prolapse and left inguinal hernia.  I saw her originally in consultation for a left inguinal hernia repair at the time of urologic procedure for pelvic organ prolapse.  On exam she had a palpable large left inguinal hernia.  I recommended robotic left inguinal hernia repair with mesh.  The procedure itself as well as its risk, benefits, and alternatives were discussed with the patient who granted consent to proceed.  We discussed if there was a right sided hernia identified laparoscopically, we would consider fixing it at the time of surgery based on its appearance.  The risk discussed included but were not limited to the risk of  infection, bleeding, damage nearby structures, recurrent hernia, mesh infection requiring removal.  After full discussion we proceeded the operating room with the urology team.  Findings:  Technique: Robotic transabdominal preperitoneal (TAPP) Hernia Location: Left indirect hernia, left obturator hernia, right direct hernia, right indirect hernia Mesh Size &Type: Large left and large right Bard 3d Max meshes Mesh Fixation: 2-0 Ethibond point fixation  Description of Procedure: The patient was positioned supine, padded and secured to the bed, with both arms tucked.  The abdomen was widely prepped and draped.  A time out procedure was performed.  The abdomen was entered and trochars placed in positions for the urology team.    The robot was then docked in the standard fashion, and the procedure begun.  The two Bard 3D max meshes, 3 oh VueLock suture and 0 Ethibond sutures were brought in through the assistant trocar during the case.    There was an indirect and direct inguinal hernia on the right that was clearly visible after pneumoperitoneum was established.  Decision was made to repair this hernia in addition to the left hernia.Marland Kitchen  Utilizing a transabdominal pre peritoneal technique (TAPP), a horizontal incision was made in the peritoneum, below the umbilicus.  Dissection was carried out in the pre peritoneal space down to the level of the hernia sac which was reduced into the peritoneal cavity completely.  The round ligament was divided..  A large pre peritoneal dissection was performed to uncover the direct, indirect, femoral and obturator spaces.  Cooper's ligament was uncovered medially and the psoas muscle uncovered laterally.  The mesh was advanced into  the pre peritoneal position so that it more than adequately covered the indirect, direct, femoral and obturator spaces.  The mesh laid flat, with no inferior folds and covered the entire myopectineal orifice.  The mesh was fixated to Cooper's  ligament using 0 Ethibond suture into the lower midline abdominal wall using 0 Ethibond suture.    There was an indirect hernia and an obturator hernia on the left.  Utilizing a transabdominal pre peritoneal technique (TAPP), a horizontal incision was made in the peritoneum, below the umbilicus.  Dissection was carried out in the pre peritoneal space down to the level of the hernia sac which was reduced into the peritoneal cavity completely.  The round ligament was divided..  A large pre peritoneal dissection was performed to uncover the direct, indirect, femoral and obturator spaces.  Cooper's ligament was uncovered medially and the psoas muscle uncovered laterally.  The mesh was advanced into the pre peritoneal position so that it more than adequately covered the indirect, direct, femoral and obturator spaces.  The mesh laid flat, with no inferior folds and covered the entire myopectineal orifice.  The mesh was fixated to Cooper's ligament using 0 Ethibond suture into the lower midline abdominal wall using 0 Ethibond suture.    The peritoneal flap was closed with the 3-0 V loc barbed suture.  There were no peritoneal defects or exposed mesh at the conclusion.  At this point the case was turned back over to the urology team, please see their dictation for additional details.    Ivar Drape, MD General, Bariatric, & Minimally Invasive Surgery [facility], [place]

## 2020-08-16 NOTE — H&P (Signed)
Admitting Physician: Hyman Hopes Trasean Delima  Service: General surgery  CC: Left inguinal hernia, pelvic organ prolapse  Subjective   HPI: Stacy Glenn is an 87 y.o. female who is here for elective robotic left inguinal hernia repair at time of robotic sacrocolpopexy and hysterectomy with urology.    Past Medical History:  Diagnosis Date   External hemorrhoids    Hypertension    Hypothyroidism    Internal hemorrhoids     Past Surgical History:  Procedure Laterality Date   COLONOSCOPY     NO PAST SURGERIES      Family History  Problem Relation Age of Onset   Heart disease Mother    Heart disease Father    Heart disease Brother    Heart disease Brother    Breast cancer Paternal Aunt    Colon cancer Neg Hx    Stomach cancer Neg Hx    Rectal cancer Neg Hx    Esophageal cancer Neg Hx    Liver cancer Neg Hx     Social:  reports that she has never smoked. She has never used smokeless tobacco. She reports that she does not drink alcohol and does not use drugs.  Allergies:  Allergies  Allergen Reactions   Other Other (See Comments)   Aleve [Naproxen] Rash    Medications: Current Outpatient Medications  Medication Instructions   calcium citrate (CALCITRATE - DOSED IN MG ELEMENTAL CALCIUM) 950 (200 Ca) MG tablet 400 mg of elemental calcium, Oral, Daily at bedtime   levothyroxine (SYNTHROID) 100 mcg, Oral, Daily before breakfast   Multiple Vitamin (MULTIVITAMIN WITH MINERALS) TABS tablet 1 tablet, Oral, Daily, [employer]   Multiple Vitamins-Minerals (PRESERVISION AREDS) CAPS 1 capsule, Oral, Daily   nitrofurantoin (MACRODANTIN) 50 mg, Oral, Daily at bedtime   Polyethylene Glycol 400 (BLINK TEARS) 0.25 % SOLN 1 drop, Both Eyes, 2 times daily    ROS - all of the below systems have been reviewed with the patient and positives are indicated with bold text General: chills, fever or night sweats Eyes: blurry vision or double vision ENT: epistaxis or sore  throat Allergy/Immunology: itchy/watery eyes or nasal congestion Hematologic/Lymphatic: bleeding problems, blood clots or swollen lymph nodes Endocrine: temperature intolerance or unexpected weight changes Breast: new or changing breast lumps or nipple discharge Resp: cough, shortness of breath, or wheezing CV: chest pain or dyspnea on exertion GI: as per HPI GU: dysuria, trouble voiding, or hematuria MSK: joint pain or joint stiffness Neuro: TIA or stroke symptoms Derm: pruritus and skin lesion changes Psych: anxiety and depression  Objective   PE Blood pressure (!) 137/50, pulse 65, temperature 97.9 F (36.6 C), temperature source Oral, resp. rate 18, height 5\' 4"  (1.626 m), weight 68 kg, SpO2 97 %. Constitutional: NAD; conversant; no deformities Eyes: Moist conjunctiva; no lid lag; anicteric; PERRL Neck: Trachea midline; no thyromegaly Lungs: Normal respiratory effort; no tactile fremitus CV: RRR; no palpable thrills; no pitting edema GI: Abd Reducible left inguinal hernia; no palpable hepatosplenomegaly MSK: Normal range of motion of extremities; no clubbing/cyanosis Psychiatric: Appropriate affect; alert and oriented x3 Lymphatic: No palpable cervical or axillary lymphadenopathy  No results found for this or any previous visit (from the past 24 hour(s)).  Imaging Orders  No imaging studies ordered today     Assessment and Plan   Stacy Glenn is an 87 y.o. female who is here for elective robotic left inguinal hernia repair at time of robotic sacrocolpopexy and hysterectomy with urology.   The risks,  benefits and alternatives have been discussed and the patient granted consent to proceed.  We will proceed with urology team as scheduled.  Left inguinal hernia - reducible Pelvic organ prolapse    Quentin Ore, MD  [facility] Use [email or web address] to contact on call provider

## 2020-08-16 NOTE — Anesthesia Postprocedure Evaluation (Signed)
Anesthesia Post Note  Patient: Stacy Glenn  Procedure(s) Performed: XI ROBOTIC ASSISTED LAPAROSCOPIC SACROCOLPOPEXY (Bilateral: Abdomen) XI ROBOTIC ASSISTED LAPAROSCOPIC HYSTERECTOMY AND SALPINGECTOMY (Abdomen) XI ROBOTIC ASSISTED INGUINAL HERNIA REPAIR WITH MESH (Bilateral: Abdomen)     Patient location during evaluation: PACU Anesthesia Type: General Level of consciousness: awake and alert Pain management: pain level controlled Vital Signs Assessment: post-procedure vital signs reviewed and stable Respiratory status: spontaneous breathing, nonlabored ventilation, respiratory function stable and patient connected to nasal cannula oxygen Cardiovascular status: blood pressure returned to baseline and stable Postop Assessment: no apparent nausea or vomiting Anesthetic complications: no   No notable events documented.  Last Vitals:  Vitals:   08/16/20 1530 08/16/20 1550  BP: (!) (P) 125/53 (!) 161/76  Pulse:  88  Resp: (P) 16 12  Temp: (!) (P) 35.8 C (!) 36.4 C  SpO2:  98%    Last Pain:  Vitals:   08/16/20 1844  TempSrc:   PainSc: 9                  Aizen Duval S

## 2020-08-17 ENCOUNTER — Encounter (HOSPITAL_COMMUNITY): Payer: Self-pay | Admitting: Urology

## 2020-08-17 DIAGNOSIS — N814 Uterovaginal prolapse, unspecified: Secondary | ICD-10-CM | POA: Diagnosis not present

## 2020-08-17 LAB — BASIC METABOLIC PANEL
Anion gap: 6 (ref 5–15)
BUN: 17 mg/dL (ref 8–23)
CO2: 26 mmol/L (ref 22–32)
Calcium: 8.3 mg/dL — ABNORMAL LOW (ref 8.9–10.3)
Chloride: 104 mmol/L (ref 98–111)
Creatinine, Ser: 0.76 mg/dL (ref 0.44–1.00)
GFR, Estimated: >60 mL/min (ref >60)
Glucose, Bld: 105 mg/dL — ABNORMAL HIGH (ref 70–99)
Potassium: 3.7 mmol/L (ref 3.5–5.1)
Sodium: 136 mmol/L (ref 135–145)

## 2020-08-17 LAB — CBC
HCT: 27.9 % — ABNORMAL LOW (ref 36.0–46.0)
Hemoglobin: 8.9 g/dL — ABNORMAL LOW (ref 12.0–15.0)
MCH: 30.9 pg (ref 26.0–34.0)
MCHC: 31.9 g/dL (ref 30.0–36.0)
MCV: 96.9 fL (ref 80.0–100.0)
Platelets: 176 10*3/uL (ref 150–400)
RBC: 2.88 MIL/uL — ABNORMAL LOW (ref 3.87–5.11)
RDW: 13.9 % (ref 11.5–15.5)
WBC: 8 10*3/uL (ref 4.0–10.5)
nRBC: 0 % (ref 0.0–0.2)

## 2020-08-17 NOTE — Discharge Summary (Signed)
[facility] Discharge Summary  Admit date: 08/16/2020  Discharge date and time: 08/17/20   Discharge to: Home  Discharge Service: Urology  Discharge Attending Physician:  Cordella Register, MD  Discharge  Diagnoses: Uterine prolapse  Secondary Diagnosis: Active Problems:   Uterine prolapse   OR Procedures: Procedure(s): XI ROBOTIC ASSISTED LAPAROSCOPIC SACROCOLPOPEXY XI ROBOTIC ASSISTED LAPAROSCOPIC HYSTERECTOMY AND SALPINGECTOMY XI ROBOTIC ASSISTED INGUINAL HERNIA REPAIR WITH MESH 08/16/2020   Ancillary Procedures: None   Discharge Day Services: The patient was seen and examined by the Urology team both in the morning and immediately prior to discharge.  Vital signs and laboratory values were stable and within normal limits.  The physical exam was benign and unchanged and all surgical wounds were examined.  Discharge instructions were explained and all questions answered.  Subjective  No acute events overnight. Pain Controlled. No fever or chills.  Objective Patient Vitals for the past 8 hrs:  BP Temp Temp src Pulse Resp SpO2  08/17/20 0423 (!) 122/56 97.8 F (36.6 C) Oral 82 20 96 %  08/17/20 0024 (!) 103/45 97.8 F (36.6 C) Oral 79 20 91 %   No intake/output data recorded.  General Appearance:        No acute distress Lungs:                       Normal work of breathing on room air Heart:                                Regular rate and rhythm Abdomen:                         Soft, non-tender, non-distended Extremities:                      Warm and well perfused   Hospital Course:  The patient underwent robotic hysterectomy and sacrocolpopexy with Dr. Marlou Porch and robotic bilateral inguinal hernia repair with Dr.Stechschulte On 08/16/2020.  The patient tolerated the procedure well, was extubated in the OR, and afterwards was taken to the PACU for routine post-surgical care. When stable the patient was transferred to the floor.   The patient did well postoperatively.   The patient's diet was slowly advanced and at the time of discharge was tolerating a regular diet.  The patient was discharged home 1 Day Post-Op, at which point was tolerating a regular solid diet, was able to void spontaneously, have adequate pain control with P.O. pain medication, and could ambulate without difficulty. The patient will follow up with [place] for post op check.   Condition at Discharge: Improved  Discharge Medications:  Allergies as of 08/17/2020       Reactions   Other Other (See Comments)   Aleve [naproxen] Rash        Medication List     STOP taking these medications    multivitamin with minerals Tabs tablet   PreserVision AREDS Caps       TAKE these medications    Blink Tears 0.25 % Soln Generic drug: Polyethylene Glycol 400 Place 1 drop into both eyes 2 (two) times daily.   calcium citrate 950 (200 Ca) MG tablet Commonly known as: CALCITRATE - dosed in mg elemental calcium Take 400 mg of elemental calcium by mouth at bedtime.   docusate sodium 100 MG capsule Commonly known as: COLACE Take 1 capsule (  100 mg total) by mouth 2 (two) times daily.   levothyroxine 100 MCG tablet Commonly known as: SYNTHROID Take 1 tablet (100 mcg total) by mouth daily before breakfast.   nitrofurantoin 50 MG capsule Commonly known as: MACRODANTIN Take 50 mg by mouth at bedtime.   traMADol 50 MG tablet Commonly known as: Ultram Take 1-2 tablets (50-100 mg total) by mouth every 6 (six) hours as needed for moderate pain or severe pain.

## 2020-08-17 NOTE — Progress Notes (Signed)
Pt to be discharged to home today. Pt and Pt's Daughter in law given discharge teaching including all discharge Medications including schedules for these Medications. Pt and Pt's family verbalized understanding

## 2020-08-17 NOTE — Progress Notes (Signed)
Progress Note: General Surgery Service   Chief Complaint/Subjective: Feeling well this morning, sore around incisions.  Looking forward to going home.  Objective: Vital signs in last 24 hours: Temp:  [96.3 F (35.7 C)-98 F (36.7 C)] 97.8 F (36.6 C) (07/14 0423) Pulse Rate:  [77-88] 82 (07/14 0423) Resp:  [12-25] 20 (07/14 0423) BP: (103-161)/(45-76) 122/56 (07/14 0423) SpO2:  [91 %-100 %] 96 % (07/14 0423) Last BM Date: 08/15/20  Intake/Output from previous day: 07/13 0701 - 07/14 0700 In: 4368.9 [P.O.:480; I.V.:3238.9; IV Piggyback:650] Out: 2450 [Urine:2200; Blood:250] Intake/Output this shift: No intake/output data recorded.  Constitutional: NAD; conversant; no deformities Eyes: Moist conjunctiva; no lid lag; anicteric; PERRL Neck: Trachea midline; no thyromegaly Lungs: Normal respiratory effort; no tactile fremitus CV: RRR; no palpable thrills; no pitting edema GI: Abd Incisions c/d/i; no palpable hepatosplenomegaly MSK: Normal range of motion of extremities; no clubbing/cyanosis Psychiatric: Appropriate affect; alert and oriented x3 Lymphatic: No palpable cervical or axillary lymphadenopathy  Lab Results: CBC  Recent Labs    08/16/20 1441 08/17/20 0410  WBC  --  8.0  HGB 10.7* 8.9*  HCT 34.9* 27.9*  PLT  --  176   BMET Recent Labs    08/17/20 0410  NA 136  K 3.7  CL 104  CO2 26  GLUCOSE 105*  BUN 17  CREATININE 0.76  CALCIUM 8.3*   PT/INR No results for input(s): LABPROT, INR in the last 72 hours. ABG No results for input(s): PHART, HCO3 in the last 72 hours.  Invalid input(s): PCO2, PO2  Anti-infectives: Anti-infectives (From admission, onward)    Start     Dose/Rate Route Frequency Ordered Stop   08/16/20 1800  ceFAZolin (ANCEF) IVPB 1 g/50 mL premix        1 g 100 mL/hr over 30 Minutes Intravenous Every 8 hours 08/16/20 1606 08/17/20 0140   08/16/20 0616  ceFAZolin (ANCEF) 2 g in sodium chloride 0.9 % 100 mL IVPB        2 g 200 mL/hr  over 30 Minutes Intravenous 30 min pre-op 08/16/20 0616 08/16/20 9485       Medications: Scheduled Meds:  feeding supplement  1 Container Oral TID BM   ketorolac  1 drop Left Eye QID   ketorolac  15 mg Intravenous Q6H   levothyroxine  100 mcg Oral QAC breakfast   Continuous Infusions:  acetaminophen 1,000 mg (08/17/20 0521)   PRN Meds:.ondansetron, traMADol  Assessment/Plan: s/p Procedure(s): XI ROBOTIC ASSISTED LAPAROSCOPIC SACROCOLPOPEXY XI ROBOTIC ASSISTED LAPAROSCOPIC HYSTERECTOMY AND SALPINGECTOMY XI ROBOTIC ASSISTED INGUINAL HERNIA REPAIR WITH MESH 08/16/2020  HGB decreased likely secondary blood loss intra-op as well as hemodilution Vitals stable Tolerating diet Walked the whole hallway Care plans per urology team, possibly home today    LOS: 0 days     Quentin Ore, MD  [facility] Use [email or web address] to contact on call provider

## 2020-08-17 NOTE — Progress Notes (Signed)
Urology Progress Note   1 Day Post-Op from robotic hysterectomy and sacrocolpopexy with Dr. Marlou Porch and robotic bilateral inguinal hernia repair with Dr. Dossie Der.   Subjective: NAEON.  Pain well controlled has not used any as needed's.  Vital signs are stable.  Making good urine output.  Ambulated in the halls.  Hemoglobin 8.9 from 10.7.  Electrolytes within normal limits.  Objective: Vital signs in last 24 hours: Temp:  [96.3 F (35.7 C)-98 F (36.7 C)] 97.8 F (36.6 C) (07/14 0423) Pulse Rate:  [77-88] 82 (07/14 0423) Resp:  [12-25] 20 (07/14 0423) BP: (103-161)/(45-76) 122/56 (07/14 0423) SpO2:  [91 %-100 %] 96 % (07/14 0423)  Intake/Output from previous day: 07/13 0701 - 07/14 0700 In: 4368.9 [P.O.:480; I.V.:3238.9; IV Piggyback:650] Out: 2450 [Urine:2200; Blood:250] Intake/Output this shift: Total I/O In: 1666.9 [P.O.:240; I.V.:1126.9; IV Piggyback:300] Out: 1500 [Urine:1500]  Physical Exam:  General: Alert and oriented CV: Regular rate Lungs: No increased work of breathing Abdomen:  Soft, appropriately tender. Incisions c/d/i.  GU: Voiding spontaneously Ext: NT, No erythema  Lab Results: Recent Labs    08/16/20 1441 08/17/20 0410  HGB 10.7* 8.9*  HCT 34.9* 27.9*   Recent Labs    08/17/20 0410  NA 136  K 3.7  CL 104  CO2 26  GLUCOSE 105*  BUN 17  CREATININE 0.76  CALCIUM 8.3*    Studies/Results: No results found.  Assessment/Plan:  87 y.o. female s/p robotic sacrocolpopexy, hysterectomy, bilateral inguinal hernia repair joint case with urology and general surgery.  Overall doing well post-op.   -Discontinue IV fluids today- -Continue to ambulate in the hallway -As needed analgesics -Home meds -Trial of void  Dispo: Likely home today   LOS: 0 days

## 2020-08-17 NOTE — Plan of Care (Signed)

## 2020-08-18 LAB — SURGICAL PATHOLOGY

## 2020-09-04 ENCOUNTER — Other Ambulatory Visit: Payer: Self-pay | Admitting: Internal Medicine

## 2020-09-04 DIAGNOSIS — M8580 Other specified disorders of bone density and structure, unspecified site: Secondary | ICD-10-CM

## 2020-09-21 ENCOUNTER — Other Ambulatory Visit: Payer: Medicare Other

## 2021-02-23 ENCOUNTER — Ambulatory Visit
Admission: RE | Admit: 2021-02-23 | Discharge: 2021-02-23 | Disposition: A | Discharge status: Home / Self Care | Payer: Medicare Other | Source: Ambulatory Visit | Attending: Internal Medicine | Admitting: Internal Medicine

## 2021-02-23 DIAGNOSIS — M8580 Other specified disorders of bone density and structure, unspecified site: Secondary | ICD-10-CM

## 2021-03-26 ENCOUNTER — Other Ambulatory Visit: Payer: Self-pay | Admitting: Internal Medicine

## 2021-03-26 DIAGNOSIS — Z1231 Encounter for screening mammogram for malignant neoplasm of breast: Secondary | ICD-10-CM

## 2021-05-09 ENCOUNTER — Ambulatory Visit
Admission: RE | Admit: 2021-05-09 | Discharge: 2021-05-09 | Disposition: A | Discharge status: Home / Self Care | Payer: Medicare Other | Source: Ambulatory Visit | Attending: Internal Medicine | Admitting: Internal Medicine

## 2021-05-09 DIAGNOSIS — Z1231 Encounter for screening mammogram for malignant neoplasm of breast: Secondary | ICD-10-CM

## 2021-09-10 ENCOUNTER — Other Ambulatory Visit: Payer: Self-pay | Admitting: Internal Medicine

## 2021-09-10 DIAGNOSIS — M792 Neuralgia and neuritis, unspecified: Secondary | ICD-10-CM

## 2021-09-12 ENCOUNTER — Ambulatory Visit
Admission: RE | Admit: 2021-09-12 | Discharge: 2021-09-12 | Disposition: A | Discharge status: Home / Self Care | Payer: Medicare Other | Source: Ambulatory Visit | Attending: Internal Medicine | Admitting: Internal Medicine

## 2021-09-12 DIAGNOSIS — M792 Neuralgia and neuritis, unspecified: Secondary | ICD-10-CM

## 2021-09-12 MED ORDER — GADOBENATE DIMEGLUMINE 529 MG/ML IV SOLN
14.0000 mL | Freq: Once | INTRAVENOUS | Status: AC | PRN
  Administered 2021-09-12: 14 mL via INTRAVENOUS

## 2021-09-13 ENCOUNTER — Encounter (HOSPITAL_BASED_OUTPATIENT_CLINIC_OR_DEPARTMENT_OTHER): Payer: Self-pay

## 2021-09-13 ENCOUNTER — Emergency Department (HOSPITAL_BASED_OUTPATIENT_CLINIC_OR_DEPARTMENT_OTHER)
Admission: EM | Admit: 2021-09-13 | Discharge: 2021-09-13 | Disposition: A | Discharge status: Home / Self Care | Payer: Medicare Other | Attending: Emergency Medicine | Admitting: Emergency Medicine

## 2021-09-13 ENCOUNTER — Other Ambulatory Visit: Payer: Self-pay

## 2021-09-13 ENCOUNTER — Other Ambulatory Visit: Payer: Self-pay | Admitting: Internal Medicine

## 2021-09-13 DIAGNOSIS — R519 Headache, unspecified: Secondary | ICD-10-CM | POA: Diagnosis not present

## 2021-09-13 DIAGNOSIS — Z79899 Other long term (current) drug therapy: Secondary | ICD-10-CM | POA: Insufficient documentation

## 2021-09-13 DIAGNOSIS — I10 Essential (primary) hypertension: Secondary | ICD-10-CM | POA: Insufficient documentation

## 2021-09-13 DIAGNOSIS — M542 Cervicalgia: Secondary | ICD-10-CM | POA: Diagnosis present

## 2021-09-13 MED ORDER — CYCLOBENZAPRINE HCL 5 MG PO TABS: 5.0000 mg | ORAL_TABLET | Freq: Two times a day (BID) | ORAL | 0 refills | Status: AC | PRN

## 2021-09-13 NOTE — ED Provider Notes (Signed)
[facility] EMERGENCY DEPT Provider Note   CSN: [id number] Arrival date & time: 09/13/21  1857     History  Chief Complaint  Patient presents with   Neck Pain    Stacy Glenn is a 88 y.o. female.  Patient here with some ongoing headache/neck pain.  Normal vitals.  No significant medical history except for hypertension.  Actually had an MRI of her brain yesterday.  She comes here today with some ongoing pain.  Tylenol has helped.  Denies any weakness, numbness, chills.  No trauma.  No tingling or weakness in her upper extremities.  No chest pain or shortness of breath.  Denies any ear pain.  The history is provided by the patient.       Home Medications Prior to Admission medications   Medication Sig Start Date End Date Taking? Authorizing Provider  cyclobenzaprine (FLEXERIL) 5 MG tablet Take 1 tablet (5 mg total) by mouth 2 (two) times daily as needed for up to 10 doses for muscle spasms. 09/13/21  Yes Finnegan Gatta, DO  calcium citrate (CALCITRATE - DOSED IN MG ELEMENTAL CALCIUM) 950 (200 Ca) MG tablet Take 400 mg of elemental calcium by mouth at bedtime.    [provider]  docusate sodium (COLACE) 100 MG capsule Take 1 capsule (100 mg total) by mouth 2 (two) times daily. 08/16/20   Harrie Foreman, PA-C  levothyroxine (SYNTHROID) 100 MCG tablet Take 1 tablet (100 mcg total) by mouth daily before breakfast. 02/02/20   Osvaldo Shipper, MD  nitrofurantoin (MACRODANTIN) 50 MG capsule Take 50 mg by mouth at bedtime. 06/28/20   [provider]  Polyethylene Glycol 400 (BLINK TEARS) 0.25 % SOLN Place 1 drop into both eyes 2 (two) times daily.    [provider]  traMADol (ULTRAM) 50 MG tablet Take 1-2 tablets (50-100 mg total) by mouth every 6 (six) hours as needed for moderate pain or severe pain. 08/16/20   Harrie Foreman, PA-C      Allergies    Other and Aleve [naproxen]    Review of Systems   Review of Systems  Physical Exam Updated  Vital Signs BP (!) 150/59 (BP Location: Right Arm)   Pulse 67   Temp 98.6 F (37 C) (Temporal)   Resp 16   Ht 5\' 4"  (1.626 m)   Wt 68 kg   SpO2 100%   BMI 25.73 kg/m  Physical Exam Vitals and nursing note reviewed.  Constitutional:      General: She is not in acute distress.    Appearance: She is well-developed.  HENT:     Head: Normocephalic and atraumatic.     Right Ear: Tympanic membrane normal.     Left Ear: Tympanic membrane normal.     Nose: Nose normal.     Mouth/Throat:     Mouth: Mucous membranes are moist.  Eyes:     Extraocular Movements: Extraocular movements intact.     Conjunctiva/sclera: Conjunctivae normal.     Pupils: Pupils are equal, round, and reactive to light.  Cardiovascular:     Rate and Rhythm: Normal rate and regular rhythm.     Pulses: Normal pulses.     Heart sounds: Normal heart sounds. No murmur heard. Pulmonary:     Effort: Pulmonary effort is normal. No respiratory distress.     Breath sounds: Normal breath sounds.  Abdominal:     Palpations: Abdomen is soft.     Tenderness: There is no abdominal tenderness.  Musculoskeletal:  General: Tenderness present. No swelling.     Cervical back: Neck supple.     Comments: May be some tenderness in the upper cervical paraspinal muscles, no midline spinal tenderness  Skin:    General: Skin is warm and dry.     Capillary Refill: Capillary refill takes less than 2 seconds.  Neurological:     General: No focal deficit present.     Mental Status: She is alert and oriented to person, place, and time.     Cranial Nerves: No cranial nerve deficit.     Sensory: No sensory deficit.     Motor: No weakness.     Coordination: Coordination normal.     Comments: 5+ out of 5 strength throughout, normal sensation, no drift, normal finger-nose-finger, normal speech  Psychiatric:        Mood and Affect: Mood normal.     ED Results / Procedures / Treatments   Labs (all labs ordered are listed, but  only abnormal results are displayed) Labs Reviewed - No data to display  EKG None  Radiology MR BRAIN W WO CONTRAST  Result Date: 09/12/2021 CLINICAL DATA:  Provided history: Neuropathic pain. Additional history provided by scanning technologist: Patient reports pain at base of skull, balance difficulty. EXAM: MRI HEAD WITHOUT AND WITH CONTRAST TECHNIQUE: Multiplanar, multiecho pulse sequences of the brain and surrounding structures were obtained without and with intravenous contrast. CONTRAST:  42mL MULTIHANCE GADOBENATE DIMEGLUMINE 529 MG/ML IV SOLN COMPARISON:  Brain MRI 01/30/2020. FINDINGS: Brain: Moderate generalized cerebral atrophy. Comparatively mild cerebellar atrophy. Moderate multifocal T2 FLAIR hyperintense signal abnormality within the cerebral white matter, nonspecific but compatible chronic small vessel ischemic disease. There is no acute infarct. No evidence of an intracranial mass. No extra-axial fluid collection. No midline shift. No pathologic intracranial enhancement identified. Vascular: Maintained flow voids within the proximal large arterial vessels. Skull and upper cervical spine: No focal suspicious marrow lesion. Sinuses/Orbits: No mass or acute finding within the imaged orbits. Prior bilateral ocular lens replacement. Small mucous retention cyst, and background minimal mucosal thickening, within the right maxillary sinus. Minimal mucosal thickening within the left maxillary and bilateral ethmoid sinuses. IMPRESSION: No evidence of acute intracranial abnormality. Moderate chronic small vessel image changes within the cerebral white matter, progressed from the prior MRI of 01/30/2020. Moderate generalized cerebral atrophy. Comparatively mild cerebellar atrophy. Mild paranasal sinus disease, as described. Electronically Signed   By: Kellie Simmering D.O.   On: 09/12/2021 13:54    Procedures Procedures    Medications Ordered in ED Medications - No data to display  ED Course/  Medical Decision Making/ A&P                           Medical Decision Making Risk Prescription drug management.   Stacy Glenn is here with headache/neck pain.  Normal vitals.  No fever.  She has had some vague upper neck, behind her ear pain bilaterally for the last week or so.  She had an MRI yesterday per chart review that was unremarkable.  She appears very well.  She has normal strength and sensation in her upper extremities.  No chest pain or shortness of breath.  Pain seems to be in the upper cervical muscles behind your ears.  May be some muscular process.  Could be TMJ or drug related as well.  Overall she appears very well.  Have no concern for stroke or other acute process especially given normal MRI  yesterday.  Will trial Flexeril to go along with her Tylenol.  Will have her follow-up with her primary care doctor.  Discharged in good condition.  This chart was dictated using voice recognition software.  Despite best efforts to proofread,  errors can occur which can change the documentation meaning.         Final Clinical Impression(s) / ED Diagnoses Final diagnoses:  Neck pain    Rx / DC Orders ED Discharge Orders          Ordered    cyclobenzaprine (FLEXERIL) 5 MG tablet  2 times daily PRN        09/13/21 1917              Virgina Norfolk, DO 09/13/21 1930

## 2021-09-13 NOTE — ED Triage Notes (Signed)
Patient here POV from Home.  Endorses Pain behind Bilateral Ears for Approximately 10 Days. Had MRI Brain completed Yesterday.  Given Amoxicillin and Tylenol by PCP Office. Mild Relief with Treatment. No Fevers. No Injuries. No Vision Changes.  NAD Noted during Triage. A&Ox4. GCS 15. Ambulatory.

## 2021-09-13 NOTE — Discharge Instructions (Signed)
Suspect may be muscle spasm or tension.  Take muscle relaxant as prescribed but do not take if doing any dangerous activities including driving as this medication is sedating.  Follow-up with your primary care doctor.

## 2022-04-19 ENCOUNTER — Other Ambulatory Visit: Payer: Self-pay | Admitting: Internal Medicine

## 2022-04-19 DIAGNOSIS — Z1231 Encounter for screening mammogram for malignant neoplasm of breast: Secondary | ICD-10-CM

## 2022-05-01 ENCOUNTER — Ambulatory Visit: Payer: Medicare Other

## 2022-05-07 ENCOUNTER — Ambulatory Visit
Admission: RE | Admit: 2022-05-07 | Discharge: 2022-05-07 | Disposition: A | Discharge status: Home / Self Care | Payer: Medicare Other | Source: Ambulatory Visit | Attending: Internal Medicine | Admitting: Internal Medicine

## 2022-05-07 DIAGNOSIS — Z1231 Encounter for screening mammogram for malignant neoplasm of breast: Secondary | ICD-10-CM

## 2022-05-09 ENCOUNTER — Other Ambulatory Visit: Payer: Self-pay | Admitting: Internal Medicine

## 2022-05-09 DIAGNOSIS — R928 Other abnormal and inconclusive findings on diagnostic imaging of breast: Secondary | ICD-10-CM

## 2022-05-15 ENCOUNTER — Ambulatory Visit
Admission: RE | Admit: 2022-05-15 | Discharge: 2022-05-15 | Disposition: A | Discharge status: Home / Self Care | Payer: Medicare Other | Source: Ambulatory Visit | Attending: Internal Medicine | Admitting: Internal Medicine

## 2022-05-15 DIAGNOSIS — R928 Other abnormal and inconclusive findings on diagnostic imaging of breast: Secondary | ICD-10-CM

## 2022-12-19 ENCOUNTER — Ambulatory Visit: Payer: Medicare Other | Admitting: Podiatry

## 2022-12-19 ENCOUNTER — Encounter: Payer: Self-pay | Admitting: Podiatry

## 2022-12-19 DIAGNOSIS — M79674 Pain in right toe(s): Secondary | ICD-10-CM | POA: Diagnosis not present

## 2022-12-19 DIAGNOSIS — B351 Tinea unguium: Secondary | ICD-10-CM

## 2022-12-19 DIAGNOSIS — M79675 Pain in left toe(s): Secondary | ICD-10-CM

## 2022-12-19 NOTE — Progress Notes (Signed)
Subjective:   Patient ID: Stacy Glenn, female   DOB: 89 y.o.   MRN: [id number]   HPI Patient presents with severely elongated thickened nailbeds 1-5 both feet that she cannot take care of and it has been a while since they have been treated   ROS      Objective:  Physical Exam  Neurovascular status intact severely thickened dystrophic nailbeds 1-5 both feet     Assessment:  Severe chronic mycotic nail infection with pain 1-5 both feet     Plan:  Debridement painful nailbeds 1-5 both feet no iatrogenic bleeding reappoint routine care

## 2023-01-05 DEATH — deceased

## 2023-08-12 IMAGING — MG MM DIGITAL SCREENING BILAT W/ TOMO AND CAD
6 of 10 series · 6 of 30 positions shown · non-contrast
Comparison: Previous exam(s).

CLINICAL DATA: Screening.

EXAM:
DIGITAL SCREENING BILATERAL MAMMOGRAM WITH TOMOSYNTHESIS AND CAD
TECHNIQUE: Bilateral screening digital craniocaudal and mediolateral oblique
mammograms were obtained. Bilateral screening digital breast
tomosynthesis was performed. The images were evaluated with
computer-aided detection.

[R CC synth-2D]
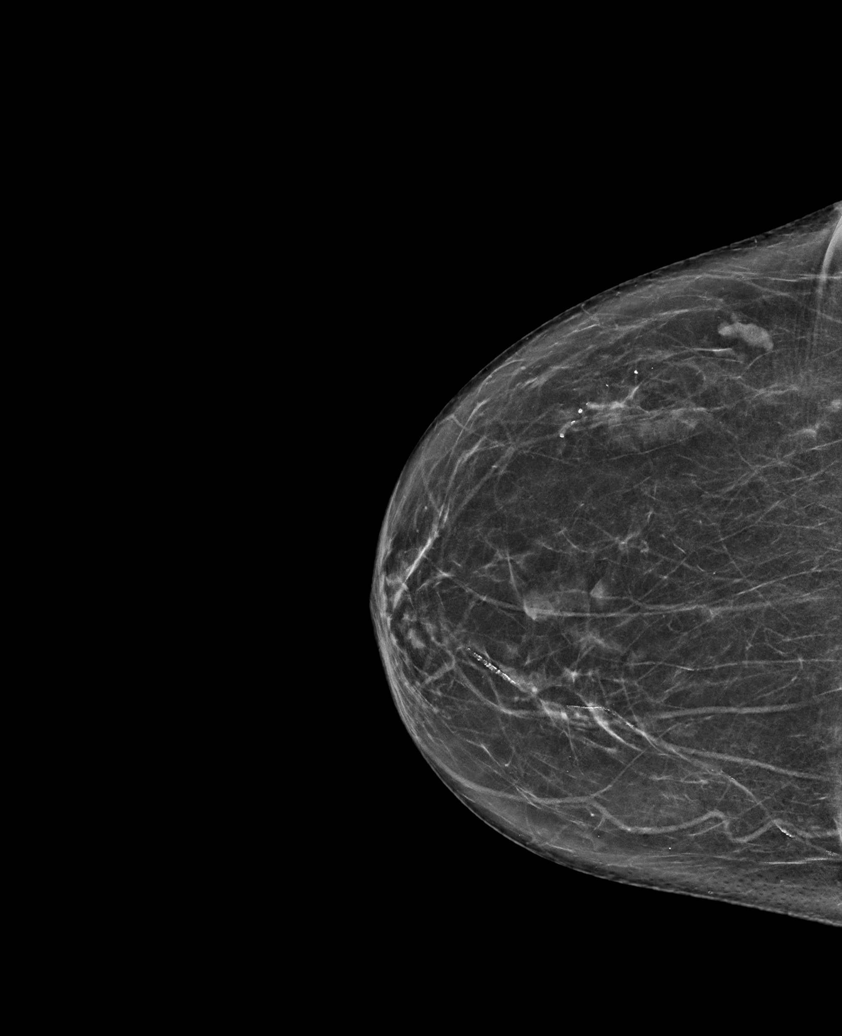

[L MLO synth-2D (1 of 2)]
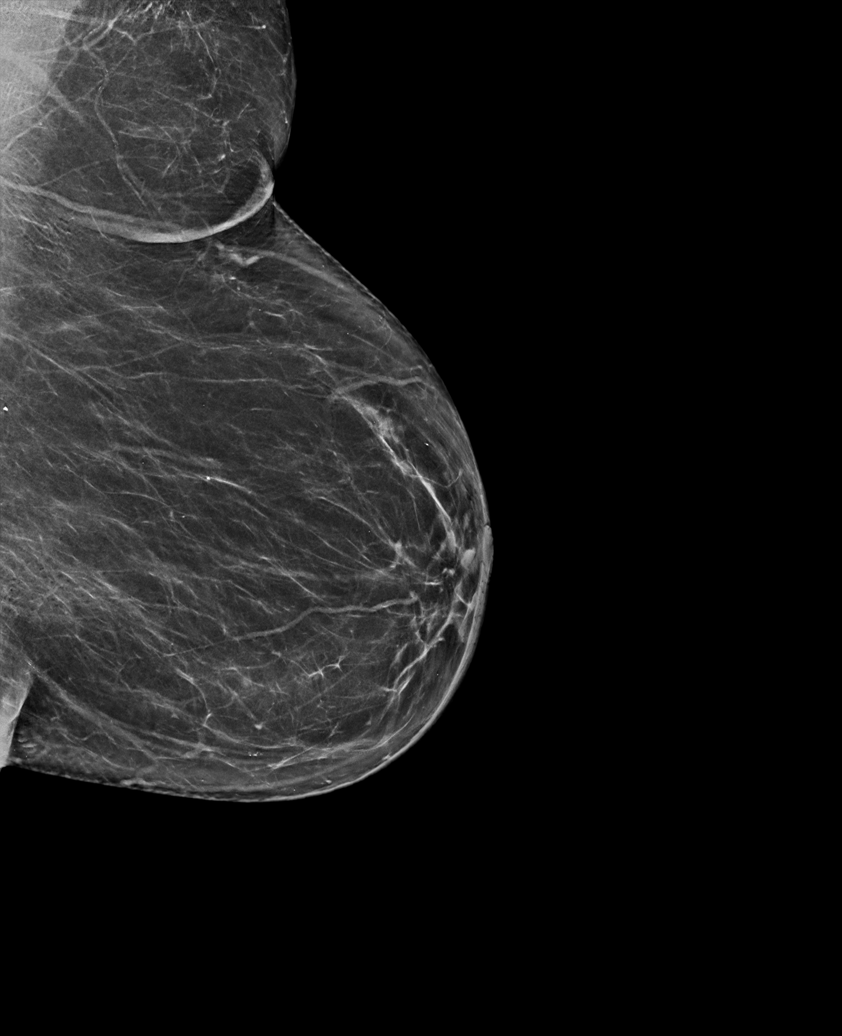

[R MLO synth-2D]
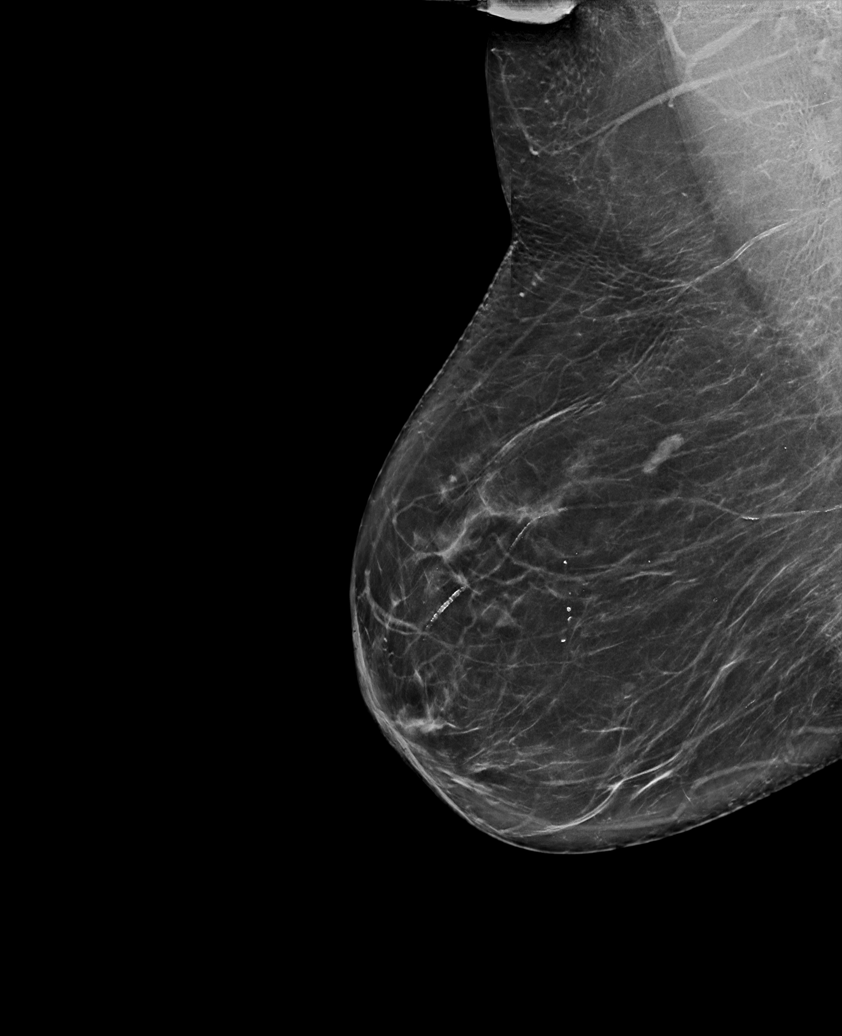

[L MLO synth-2D (2 of 2)]
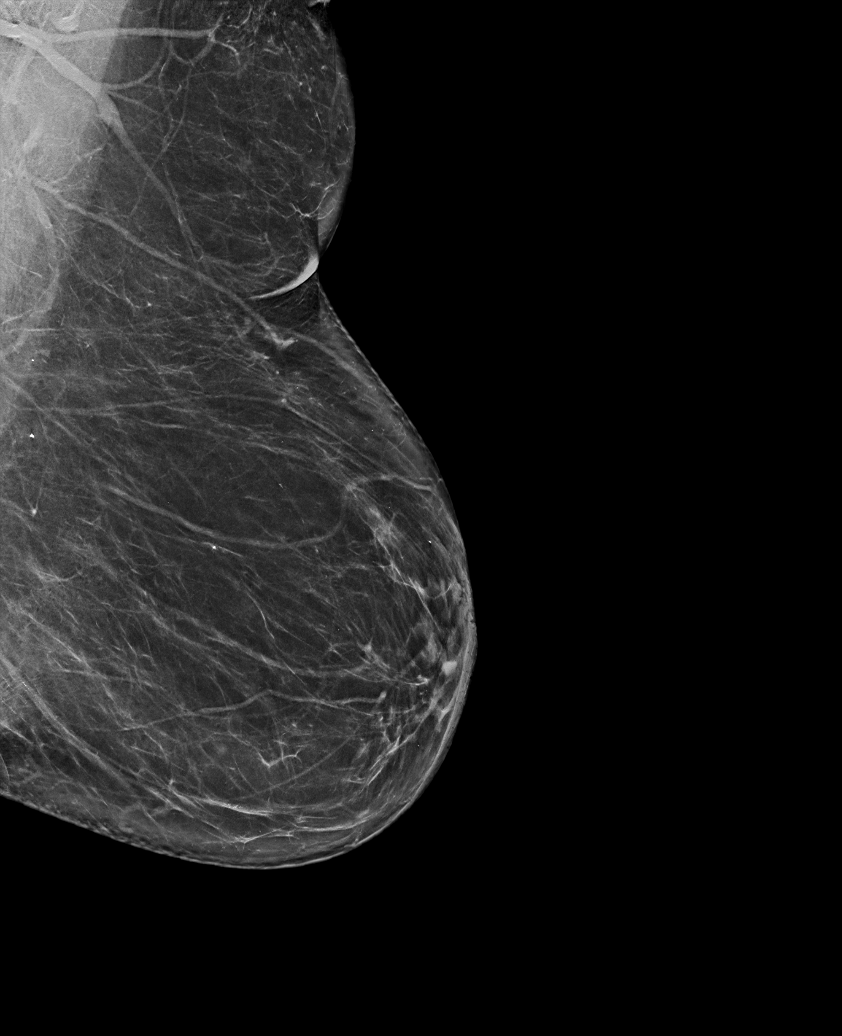

[L CC synth-2D]
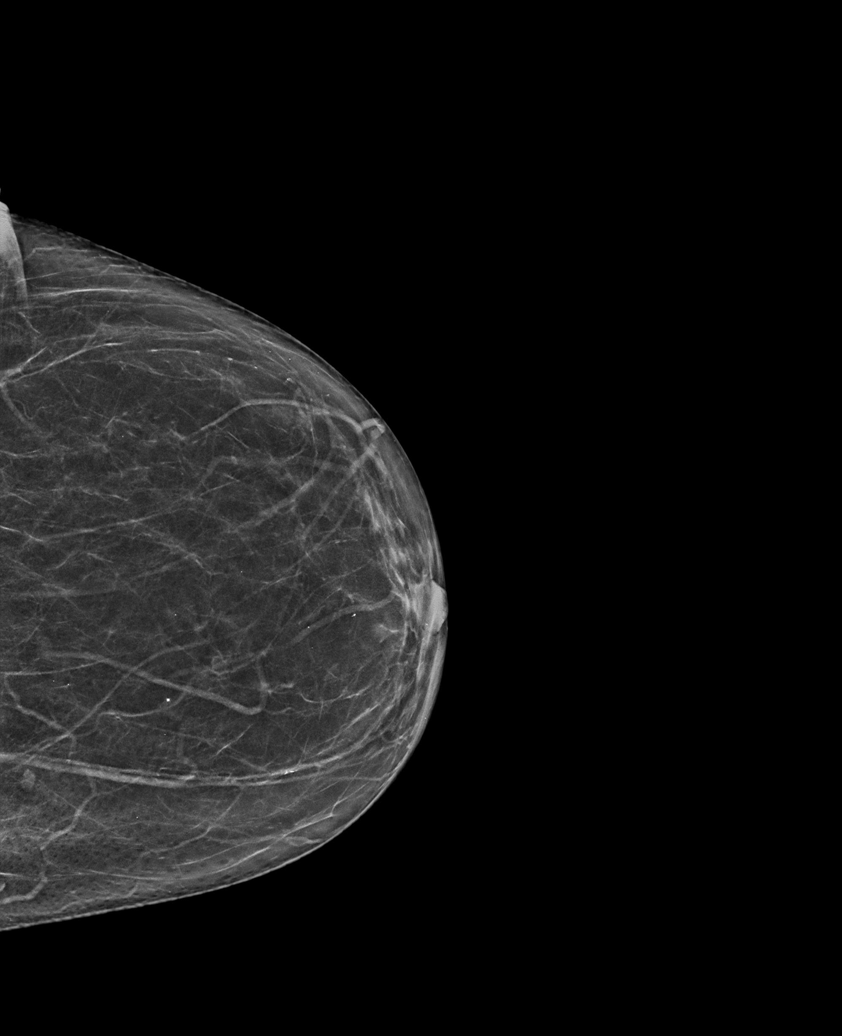

[L CC tomo · tomo slice 33/64.0]
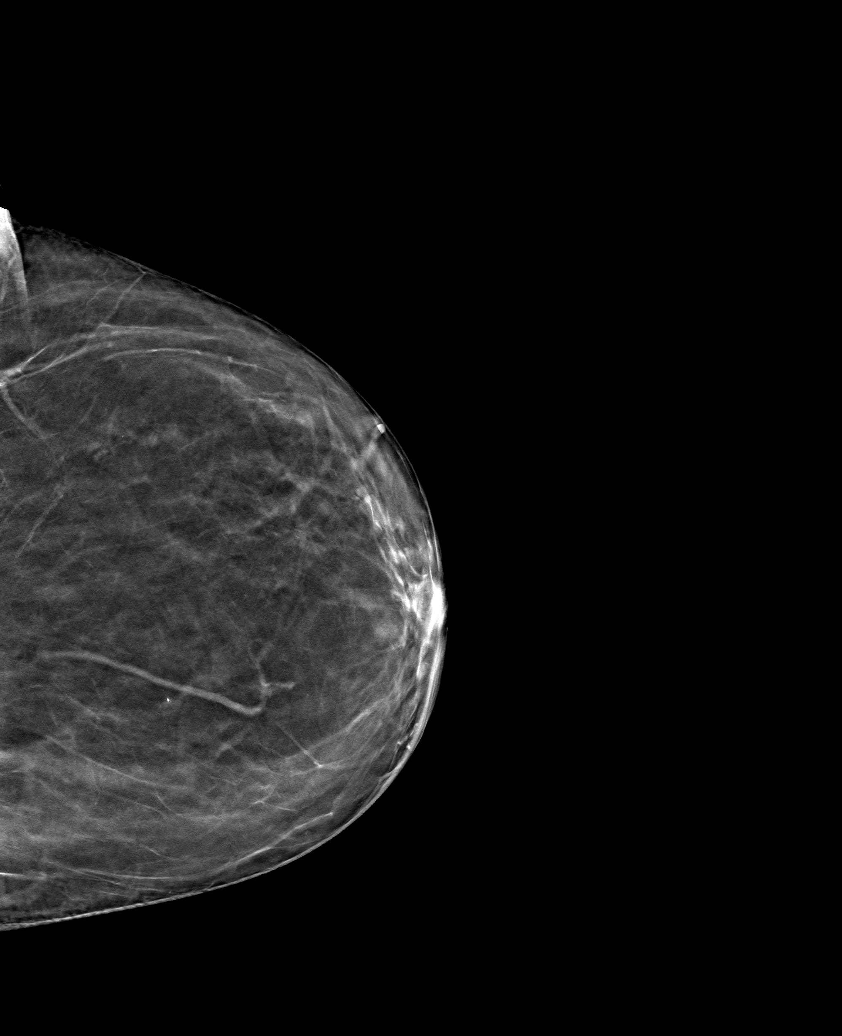

[6 of 30 positions shown; findings below may reference images not displayed]

ACR Breast Density Category b: There are scattered areas of
fibroglandular density.
FINDINGS: There are no findings suspicious for malignancy.
IMPRESSION: No mammographic evidence of malignancy. A result letter of this
screening mammogram will be mailed directly to the patient.

RECOMMENDATION:
Screening mammogram in one year. (Code:51-O-LD2)

BI-RADS CATEGORY  1: Negative.
